# Patient Record
Sex: Male | Born: 1980
Health system: Southern US, Community
[De-identification: ages and names within clinical notes are randomized; demographics above are authoritative.]

## PROBLEM LIST (undated history)

## (undated) DIAGNOSIS — J45909 Unspecified asthma, uncomplicated: Secondary | ICD-10-CM

## (undated) DIAGNOSIS — F32A Depression, unspecified: Secondary | ICD-10-CM

## (undated) DIAGNOSIS — F419 Anxiety disorder, unspecified: Secondary | ICD-10-CM

## (undated) DIAGNOSIS — A0472 Enterocolitis due to Clostridium difficile, not specified as recurrent: Secondary | ICD-10-CM

## (undated) DIAGNOSIS — K5792 Diverticulitis of intestine, part unspecified, without perforation or abscess without bleeding: Secondary | ICD-10-CM

## (undated) DIAGNOSIS — C801 Malignant (primary) neoplasm, unspecified: Secondary | ICD-10-CM

## (undated) DIAGNOSIS — T7840XA Allergy, unspecified, initial encounter: Secondary | ICD-10-CM

## (undated) HISTORY — DX: Diverticulitis of intestine, part unspecified, without perforation or abscess without bleeding: K57.92

## (undated) HISTORY — PX: WISDOM TOOTH EXTRACTION: SHX21

## (undated) HISTORY — DX: Allergy, unspecified, initial encounter: T78.40XA

## (undated) HISTORY — DX: Enterocolitis due to Clostridium difficile, not specified as recurrent: A04.72

## (undated) HISTORY — DX: Depression, unspecified: F32.A

## (undated) HISTORY — DX: Anxiety disorder, unspecified: F41.9

---

## 2006-02-24 ENCOUNTER — Ambulatory Visit: Payer: Self-pay | Admitting: Family Medicine

## 2011-05-24 DIAGNOSIS — C801 Malignant (primary) neoplasm, unspecified: Secondary | ICD-10-CM

## 2011-05-24 HISTORY — PX: MELANOMA EXCISION: SHX5266

## 2011-05-24 HISTORY — DX: Malignant (primary) neoplasm, unspecified: C80.1

## 2014-06-04 ENCOUNTER — Ambulatory Visit: Payer: Self-pay | Admitting: Internal Medicine

## 2017-01-07 ENCOUNTER — Encounter: Payer: Self-pay | Admitting: Medical Oncology

## 2017-01-07 ENCOUNTER — Emergency Department: Payer: 59

## 2017-01-07 ENCOUNTER — Inpatient Hospital Stay
Admission: EM | Admit: 2017-01-07 | Discharge: 2017-01-12 | DRG: 392 | Disposition: A | Discharge status: Home / Self Care | Payer: 59 | Attending: Surgery | Admitting: Surgery

## 2017-01-07 DIAGNOSIS — R109 Unspecified abdominal pain: Secondary | ICD-10-CM | POA: Diagnosis not present

## 2017-01-07 DIAGNOSIS — Z6841 Body Mass Index (BMI) 40.0 and over, adult: Secondary | ICD-10-CM | POA: Diagnosis not present

## 2017-01-07 DIAGNOSIS — K5732 Diverticulitis of large intestine without perforation or abscess without bleeding: Principal | ICD-10-CM | POA: Diagnosis present

## 2017-01-07 DIAGNOSIS — J45909 Unspecified asthma, uncomplicated: Secondary | ICD-10-CM | POA: Diagnosis not present

## 2017-01-07 DIAGNOSIS — K4090 Unilateral inguinal hernia, without obstruction or gangrene, not specified as recurrent: Secondary | ICD-10-CM | POA: Diagnosis present

## 2017-01-07 DIAGNOSIS — R1030 Lower abdominal pain, unspecified: Secondary | ICD-10-CM | POA: Diagnosis present

## 2017-01-07 DIAGNOSIS — K5792 Diverticulitis of intestine, part unspecified, without perforation or abscess without bleeding: Secondary | ICD-10-CM | POA: Diagnosis present

## 2017-01-07 DIAGNOSIS — K5700 Diverticulitis of small intestine with perforation and abscess without bleeding: Secondary | ICD-10-CM | POA: Diagnosis not present

## 2017-01-07 HISTORY — DX: Unspecified asthma, uncomplicated: J45.909

## 2017-01-07 HISTORY — DX: Diverticulitis of intestine, part unspecified, without perforation or abscess without bleeding: K57.92

## 2017-01-07 LAB — COMPREHENSIVE METABOLIC PANEL
ALK PHOS: 50 U/L (ref 38–126)
ALT: 37 U/L (ref 17–63)
AST: 29 U/L (ref 15–41)
Albumin: 4.3 g/dL (ref 3.5–5.0)
Anion gap: 9 (ref 5–15)
BUN: 11 mg/dL (ref 6–20)
CALCIUM: 9.1 mg/dL (ref 8.9–10.3)
CO2: 23 mmol/L (ref 22–32)
CREATININE: 1.17 mg/dL (ref 0.61–1.24)
Chloride: 103 mmol/L (ref 101–111)
GFR calc non Af Amer: >60 mL/min (ref >60)
GLUCOSE: 127 mg/dL — AB (ref 65–99)
Potassium: 4 mmol/L (ref 3.5–5.1)
Sodium: 135 mmol/L (ref 135–145)
Total Bilirubin: 1.3 mg/dL — ABNORMAL HIGH (ref 0.3–1.2)
Total Protein: 7.4 g/dL (ref 6.5–8.1)

## 2017-01-07 LAB — URINALYSIS, COMPLETE (UACMP) WITH MICROSCOPIC
Bacteria, UA: NONE SEEN
Bilirubin Urine: NEGATIVE
GLUCOSE, UA: NEGATIVE mg/dL
HGB URINE DIPSTICK: NEGATIVE
KETONES UR: NEGATIVE mg/dL
Leukocytes, UA: NEGATIVE
NITRITE: NEGATIVE
PH: 5 (ref 5.0–8.0)
Protein, ur: 30 mg/dL — AB
RBC / HPF: NONE SEEN RBC/hpf (ref 0–5)
Specific Gravity, Urine: 1.028 (ref 1.005–1.030)
Squamous Epithelial / LPF: NONE SEEN

## 2017-01-07 LAB — CBC
HCT: 49 % (ref 40.0–52.0)
Hemoglobin: 16.6 g/dL (ref 13.0–18.0)
MCH: 28.4 pg (ref 26.0–34.0)
MCHC: 33.8 g/dL (ref 32.0–36.0)
MCV: 84 fL (ref 80.0–100.0)
PLATELETS: 173 10*3/uL (ref 150–440)
RBC: 5.84 MIL/uL (ref 4.40–5.90)
RDW: 14.1 % (ref 11.5–14.5)
WBC: 10.4 10*3/uL (ref 3.8–10.6)

## 2017-01-07 LAB — LIPASE, BLOOD: Lipase: 28 U/L (ref 11–51)

## 2017-01-07 MED ORDER — LACTATED RINGERS IV SOLN
INTRAVENOUS | Status: DC
  Administered 2017-01-07: 1 mL via INTRAVENOUS
  Administered 2017-01-08 (×2): via INTRAVENOUS

## 2017-01-07 MED ORDER — OXYCODONE HCL 5 MG PO TABS
5.0000 mg | ORAL_TABLET | ORAL | Status: DC | PRN
  Administered 2017-01-08 (×3): 5 mg via ORAL
  Filled 2017-01-07 (×3): qty 1

## 2017-01-07 MED ORDER — PIPERACILLIN-TAZOBACTAM 3.375 G IVPB 30 MIN
3.3750 g | Freq: Three times a day (TID) | INTRAVENOUS | Status: DC
  Administered 2017-01-08: 3.375 g via INTRAVENOUS
  Filled 2017-01-07 (×4): qty 50

## 2017-01-07 MED ORDER — ONDANSETRON HCL 4 MG/2ML IJ SOLN
4.0000 mg | Freq: Once | INTRAMUSCULAR | Status: AC
  Administered 2017-01-07: 4 mg via INTRAVENOUS
  Filled 2017-01-07: qty 2

## 2017-01-07 MED ORDER — SODIUM CHLORIDE 0.9 % IV SOLN
1000.0000 mL | Freq: Once | INTRAVENOUS | Status: AC
  Administered 2017-01-07: 1000 mL via INTRAVENOUS

## 2017-01-07 MED ORDER — IOPAMIDOL (ISOVUE-300) INJECTION 61%
125.0000 mL | Freq: Once | INTRAVENOUS | Status: AC | PRN
  Administered 2017-01-07: 125 mL via INTRAVENOUS

## 2017-01-07 MED ORDER — HYDROMORPHONE HCL 1 MG/ML IJ SOLN
1.0000 mg | INTRAMUSCULAR | Status: DC | PRN
  Administered 2017-01-08: 1 mg via INTRAVENOUS
  Filled 2017-01-07: qty 1

## 2017-01-07 MED ORDER — HEPARIN SODIUM (PORCINE) 5000 UNIT/ML IJ SOLN
5000.0000 [IU] | Freq: Three times a day (TID) | INTRAMUSCULAR | Status: DC
  Administered 2017-01-07 – 2017-01-08 (×2): 5000 [IU] via SUBCUTANEOUS
  Filled 2017-01-07: qty 1

## 2017-01-07 MED ORDER — MORPHINE SULFATE (PF) 4 MG/ML IV SOLN
4.0000 mg | Freq: Once | INTRAVENOUS | Status: AC
  Administered 2017-01-07: 4 mg via INTRAVENOUS
  Filled 2017-01-07: qty 1

## 2017-01-07 MED ORDER — IOPAMIDOL (ISOVUE-300) INJECTION 61%
30.0000 mL | Freq: Once | INTRAVENOUS | Status: AC
  Administered 2017-01-07: 30 mL via ORAL

## 2017-01-07 MED ORDER — HEPARIN SODIUM (PORCINE) 5000 UNIT/ML IJ SOLN
INTRAMUSCULAR | Status: AC
  Administered 2017-01-07: 5000 [IU] via SUBCUTANEOUS
  Filled 2017-01-07: qty 1

## 2017-01-07 MED ORDER — ONDANSETRON HCL 4 MG PO TABS
4.0000 mg | ORAL_TABLET | Freq: Four times a day (QID) | ORAL | Status: DC | PRN
  Administered 2017-01-11: 4 mg via ORAL
  Filled 2017-01-07: qty 1

## 2017-01-07 MED ORDER — ONDANSETRON HCL 4 MG/2ML IJ SOLN: 4.0000 mg | Freq: Four times a day (QID) | INTRAMUSCULAR | Status: DC | PRN

## 2017-01-07 MED ORDER — PIPERACILLIN-TAZOBACTAM 3.375 G IVPB 30 MIN
3.3750 g | Freq: Once | INTRAVENOUS | Status: AC
  Administered 2017-01-07: 3.375 g via INTRAVENOUS

## 2017-01-07 MED ORDER — PIPERACILLIN-TAZOBACTAM 3.375 G IVPB 30 MIN
INTRAVENOUS | Status: AC
  Filled 2017-01-07: qty 50

## 2017-01-07 NOTE — ED Triage Notes (Signed)
Lower abd pain that began Thursday denies Vomiting/Diarrhea. Reports some nausea today.

## 2017-01-07 NOTE — H&P (Signed)
Scott Aguilar is an 36 y.o. male.    Chief Complaint: Lower abdominal pain  HPI: This patient with a three-day history of lower abdominal pain it has been worsening since Thursday. He denies fevers but did have some chills has had normal bowel movements without diarrhea or melena he did have an episode like this before last year that spontaneously resolved after several days. He did not require nor seek out medical care for that. The pain is worse and worsening. He points to the suprapubic area He had a single emesis upon arrival to the emergency room and remained slightly nauseated. There is no family history of colon cancer or diverticulitis  Patient takes no medications has no medical or surgical history.  He does not smoke and works in a [employer]. He is accompanied by his wife.  Past Medical History:  Diagnosis Date  . Asthma     No past surgical history on file.  No family history on file. Social History:  has no tobacco, alcohol, and drug history on file.  Allergies: No Known Allergies   (Not in a hospital admission)   Review of Systems  Constitutional: Negative for chills and fever.  HENT: Negative.   Eyes: Negative.   Respiratory: Negative.   Cardiovascular: Negative.   Gastrointestinal: Positive for abdominal pain, nausea and vomiting. Negative for blood in stool, constipation, diarrhea and melena.  Genitourinary: Negative.   Musculoskeletal: Negative.   Skin: Negative.   Neurological: Negative.   Endo/Heme/Allergies: Negative.   Psychiatric/Behavioral: Negative.      Physical Exam:  BP (!) 142/85 (BP Location: Left Arm)   Pulse 97   Temp 98.9 F (37.2 C) (Oral)   Resp 18   Ht _0  (1.905 m)   Wt (!) 400 lb (181.4 kg)   SpO2 99%   BMI 50.00 kg/m   Physical Exam  Constitutional: He is oriented to person, place, and time and well-developed, well-nourished, and in no distress. No distress.  Very large male patient in no acute distress over 400 pounds   HENT:  Head: Normocephalic and atraumatic.  Eyes: Pupils are equal, round, and reactive to light. Right eye exhibits no discharge. Left eye exhibits no discharge. No scleral icterus.  Neck: Normal range of motion.  Cardiovascular: Normal rate, regular rhythm and normal heart sounds.   Pulmonary/Chest: Effort normal and breath sounds normal. No respiratory distress. He has no wheezes. He has no rales.  Abdominal: Soft. He exhibits no distension. There is tenderness. There is guarding. There is no rebound.  Minimal tenderness in the lower quadrants left greater than right and suprapubic. There is minimal tenderness in the area of a left inguinal hernia without obvious incarceration. There are some skin changes suggestive of candidal rash in his groins  Genitourinary: Penis normal.  Musculoskeletal: Normal range of motion. He exhibits no edema or tenderness.  Lymphadenopathy:    He has no cervical adenopathy.  Neurological: He is alert and oriented to person, place, and time.  Skin: Skin is warm and dry. Rash noted. He is not diaphoretic. No erythema.  Psychiatric: Mood and affect normal.  Vitals reviewed.       Results for orders placed or performed during the hospital encounter of 01/07/17 (from the past 48 hour(s))  Lipase, blood     Status: None   Collection Time: 01/07/17  6:16 PM  Result Value Ref Range   Lipase 28 11 - 51 U/L  Comprehensive metabolic panel     Status: Abnormal  Collection Time: 01/07/17  6:16 PM  Result Value Ref Range   Sodium 135 135 - 145 mmol/L   Potassium 4.0 3.5 - 5.1 mmol/L   Chloride 103 101 - 111 mmol/L   CO2 23 22 - 32 mmol/L   Glucose, Bld 127 (H) 65 - 99 mg/dL   BUN 11 6 - 20 mg/dL   Creatinine, Ser 1.17 0.61 - 1.24 mg/dL   Calcium 9.1 8.9 - 10.3 mg/dL   Total Protein 7.4 6.5 - 8.1 g/dL   Albumin 4.3 3.5 - 5.0 g/dL   AST 29 15 - 41 U/L   ALT 37 17 - 63 U/L   Alkaline Phosphatase 50 38 - 126 U/L   Total Bilirubin 1.3 (H) 0.3 - 1.2 mg/dL    GFR calc non Af Amer >60 >60 mL/min   GFR calc Af Amer >60 >60 mL/min    Comment: (NOTE) The eGFR has been calculated using the CKD EPI equation. This calculation has not been validated in all clinical situations. eGFR's persistently <60 mL/min signify possible Chronic Kidney Disease.    Anion gap 9 5 - 15  CBC     Status: None   Collection Time: 01/07/17  6:16 PM  Result Value Ref Range   WBC 10.4 3.8 - 10.6 K/uL   RBC 5.84 4.40 - 5.90 MIL/uL   Hemoglobin 16.6 13.0 - 18.0 g/dL   HCT 49.0 40.0 - 52.0 %   MCV 84.0 80.0 - 100.0 fL   MCH 28.4 26.0 - 34.0 pg   MCHC 33.8 32.0 - 36.0 g/dL   RDW 14.1 11.5 - 14.5 %   Platelets 173 150 - 440 K/uL  Urinalysis, Complete w Microscopic     Status: Abnormal   Collection Time: 01/07/17  6:25 PM  Result Value Ref Range   Color, Urine AMBER (A) YELLOW    Comment: BIOCHEMICALS MAY BE AFFECTED BY COLOR   APPearance CLEAR (A) CLEAR   Specific Gravity, Urine 1.028 1.005 - 1.030   pH 5.0 5.0 - 8.0   Glucose, UA NEGATIVE NEGATIVE mg/dL   Hgb urine dipstick NEGATIVE NEGATIVE   Bilirubin Urine NEGATIVE NEGATIVE   Ketones, ur NEGATIVE NEGATIVE mg/dL   Protein, ur 30 (A) NEGATIVE mg/dL   Nitrite NEGATIVE NEGATIVE   Leukocytes, UA NEGATIVE NEGATIVE   RBC / HPF NONE SEEN 0 - 5 RBC/hpf   WBC, UA 0-5 0 - 5 WBC/hpf   Bacteria, UA NONE SEEN NONE SEEN   Squamous Epithelial / LPF NONE SEEN NONE SEEN   Mucous PRESENT    Ct Abdomen Pelvis W Contrast  Result Date: 01/07/2017 CLINICAL DATA:  Lower abdominal pain. EXAM: CT ABDOMEN AND PELVIS WITH CONTRAST TECHNIQUE: Multidetector CT imaging of the abdomen and pelvis was performed using the standard protocol following bolus administration of intravenous contrast. CONTRAST:  118m ISOVUE-300 IOPAMIDOL (ISOVUE-300) INJECTION 61% COMPARISON:  Abdominal ultrasound 06/04/2014 FINDINGS: Lower chest: No acute abnormality. Hepatobiliary: No focal liver abnormality is seen. No gallstones, gallbladder wall thickening, or  biliary dilatation. Pancreas: Unremarkable. No pancreatic ductal dilatation or surrounding inflammatory changes. Spleen: Normal in size without focal abnormality. Adrenals/Urinary Tract: Adrenal glands are unremarkable. Kidneys are without renal calculi, focal lesion, or hydronephrosis. 12 mm right renal cyst. Bladder is unremarkable. Stomach/Bowel: There is a relatively short segment of irregular mucosal thickening and pericolonic inflammatory changes in the distal sigmoid colon/ proximal rectum, best seen on axial image 19/115, sequence 2 or coronal image 68/112, sequence 5. Scattered diverticula are seen of the  sigmoid colon. Vascular/Lymphatic: No significant vascular findings are present. No enlarged abdominal or pelvic lymph nodes. Reproductive:  Normal prostate gland. Other: Left indirect inguinal hernia contains peritoneal fat as well as a small portion of the urinary bladder. There is an associated fat stranding. Musculoskeletal: No acute or significant osseous findings. IMPRESSION: Relatively short segment of irregular mucosal thickening and pericolonic inflammatory changes in the distal sigmoid colon/ proximal rectum on the background of scattered sigmoid diverticulosis. This may represent diverticulitis, however underlying colonic mass cannot be excluded. Indirect left inguinal hernia containing peritoneal fat and a small portion of the urinary bladder with fat stranding which may be due to inflammatory changes or potentially a strangulation. These results were called by telephone at the time of interpretation on 01/07/2017 at 9:34 pm to Dr. Lavonia Drafts , who verbally acknowledged these results. Electronically Signed   By: Fidela Salisbury M.D.   On: 01/07/2017 21:38     Assessment/Plan  CT scan is personally reviewed. There is likely signs of acute diverticulitis. There is also signs of a small portion of the urinary bladder extending into a very small fat containing left inguinal hernia. I do  not believe this is the source of his pain but rather this is acute diverticulitis.  I recommended admission in the hospital with IV antibiotics. The rationale for this was discussed the options of observation reviewed and the risks of worsening needing surgery at some point were discussed with him. I would also prefer to place a Foley catheter to decompress the urinary bladder as there is question about the small portion of urinary bladder extending into a left anal hernia which could be a source for part of his pain but I do not believe it is so. Family understood and agreed with this plan of this discussed with the emergency room physician.  Florene Glen, MD, FACS

## 2017-01-07 NOTE — ED Notes (Signed)
Pt. States lower abdominal pain that started Thursday afternoon.  Pt states increasing pain to today.  Pt. Also indicates difficulty with urine output and dysuria.

## 2017-01-07 NOTE — ED Provider Notes (Signed)
[facility] Emergency Department Provider Note   ____________________________________________    I have reviewed the triage vital signs and the nursing notes.   HISTORY  Chief Complaint Abdominal Pain     HPI Scott Aguilar is a 36 y.o. male who presents with complaints of abdominal pain. Patient reports pain started 2 days ago and has become steadily worse, this afternoon it became even more painful. He describes it as a 7 out of 10, sharp pain in his lower abdomen bilaterally. He has never had this before. No history of abdominal surgeries. No fevers or chills. No nausea or vomiting. Positive decreased appetite. No recent travel. No diarrhea, normal stools.   Past Medical History:  Diagnosis Date  . Asthma     There are no active problems to display for this patient.   No past surgical history on file.  Prior to Admission medications   Not on File     Allergies Patient has no known allergies.  No family history on file.  Social History Social History  Substance Use Topics  . Smoking status: Not on file  . Smokeless tobacco: Not on file  . Alcohol use Not on file  Does not smoke, occasional alcohol use  Review of Systems  Constitutional: No fever/chills Eyes: No visual changes.  ENT: No sore throat. Cardiovascular: Denies chest pain. Respiratory: Denies shortness of breath. Gastrointestinal: As above Genitourinary: Negative for dysuria. Musculoskeletal: Negative for back pain. Skin: Negative for rash. Neurological: Negative for headaches    ____________________________________________   PHYSICAL EXAM:  VITAL SIGNS: ED Triage Vitals  Enc Vitals Group     BP 01/07/17 1806 (!) 142/85     Pulse Rate 01/07/17 1806 99     Resp 01/07/17 1806 18     Temp 01/07/17 1806 98.9 F (37.2 C)     Temp Source 01/07/17 1806 Oral     SpO2 01/07/17 1806 96 %     Weight 01/07/17 1807 (!) 181.4 kg (400 lb)     Height 01/07/17 1807  1.905 m (6\' 3" )     Head Circumference --      Peak Flow --      Pain Score 01/07/17 1806 5     Pain Loc --      Pain Edu? --      Excl. in [place]? --     Constitutional: Alert and oriented. No acute distress.  Eyes: Conjunctivae are normal.  Head: Atraumatic. Nose: No congestion/rhinnorhea. Mouth/Throat: Mucous membranes are moist.   Neck:  Painless ROM Cardiovascular: Normal rate, regular rhythm. Grossly normal heart sounds.  Good peripheral circulation. Respiratory: Normal respiratory effort.  No retractions. Lungs CTAB. Gastrointestinal: Tenderness to palpation in the lower abdomen bilaterally, moderate. No distention.  No CVA tenderness. Genitourinary: deferred Musculoskeletal:  Warm and well perfused Neurologic:  Normal speech and language. No gross focal neurologic deficits are appreciated.  Skin:  Skin is warm, dry and intact. No rash noted. Psychiatric: Mood and affect are normal. Speech and behavior are normal.  ____________________________________________   LABS (all labs ordered are listed, but only abnormal results are displayed)  Labs Reviewed  COMPREHENSIVE METABOLIC PANEL - Abnormal; Notable for the following:       Result Value   Glucose, Bld 127 (*)    Total Bilirubin 1.3 (*)    All other components within normal limits  URINALYSIS, COMPLETE (UACMP) WITH MICROSCOPIC - Abnormal; Notable for the following:    Color, Urine AMBER (*)  APPearance CLEAR (*)    Protein, ur 30 (*)    All other components within normal limits  LIPASE, BLOOD  CBC   ____________________________________________  EKG  None ____________________________________________  RADIOLOGY  CT abdomen and pelvis pending ____________________________________________   PROCEDURES  Procedure(s) performed: No    Critical Care performed: No ____________________________________________   INITIAL IMPRESSION / ASSESSMENT AND PLAN / ED COURSE  Pertinent labs & imaging results that  were available during my care of the patient were reviewed by me and considered in my medical decision making (see chart for details).  Patient presents with lower abdominal pain, lab work is reassuring however the patient's significant tender in the lower abdomen bilaterally. Suspicious for diverticulitis. We will give IV Zofran and IV morphine, obtain CT abdomen and pelvis and reevaluate  ----------------------------------------- 9:46 PM on 01/07/2017 -----------------------------------------  Called by radiologist of possible diverticulitis/cannot exclude underlying mass as well as left inguinal hernia. Discussed with Dr. Burt Knack of surgery, he will see the patient in the ED  Dr. Burt Knack will admit the patient, requested dose of IV Zosyn in the ED    ____________________________________________   FINAL CLINICAL IMPRESSION(S) / ED DIAGNOSES  Final diagnoses:  Diverticulitis      NEW MEDICATIONS STARTED DURING THIS VISIT:  New Prescriptions   No medications on file     Note:  This document was prepared using Dragon voice recognition software and may include unintentional dictation errors.    Lavonia Drafts, MD 01/07/17 2217

## 2017-01-08 LAB — CBC
HEMATOCRIT: 44.4 % (ref 40.0–52.0)
Hemoglobin: 15.1 g/dL (ref 13.0–18.0)
MCH: 28.6 pg (ref 26.0–34.0)
MCHC: 33.9 g/dL (ref 32.0–36.0)
MCV: 84.3 fL (ref 80.0–100.0)
Platelets: 156 10*3/uL (ref 150–440)
RBC: 5.27 MIL/uL (ref 4.40–5.90)
RDW: 14.3 % (ref 11.5–14.5)
WBC: 10.9 10*3/uL — ABNORMAL HIGH (ref 3.8–10.6)

## 2017-01-08 LAB — BASIC METABOLIC PANEL
ANION GAP: 7 (ref 5–15)
BUN: 11 mg/dL (ref 6–20)
CALCIUM: 8.4 mg/dL — AB (ref 8.9–10.3)
CO2: 24 mmol/L (ref 22–32)
CREATININE: 1.08 mg/dL (ref 0.61–1.24)
Chloride: 104 mmol/L (ref 101–111)
GFR calc Af Amer: >60 mL/min (ref >60)
GFR calc non Af Amer: >60 mL/min (ref >60)
GLUCOSE: 128 mg/dL — AB (ref 65–99)
Potassium: 3.8 mmol/L (ref 3.5–5.1)
Sodium: 135 mmol/L (ref 135–145)

## 2017-01-08 MED ORDER — KETOROLAC TROMETHAMINE 30 MG/ML IJ SOLN
30.0000 mg | Freq: Four times a day (QID) | INTRAMUSCULAR | Status: AC
  Administered 2017-01-08 – 2017-01-09 (×6): 30 mg via INTRAVENOUS
  Filled 2017-01-08 (×7): qty 1

## 2017-01-08 MED ORDER — ENOXAPARIN SODIUM 40 MG/0.4ML ~~LOC~~ SOLN: 40.0000 mg | SUBCUTANEOUS | Status: DC

## 2017-01-08 MED ORDER — ENOXAPARIN SODIUM 40 MG/0.4ML ~~LOC~~ SOLN
40.0000 mg | Freq: Two times a day (BID) | SUBCUTANEOUS | Status: DC
  Administered 2017-01-08 – 2017-01-11 (×8): 40 mg via SUBCUTANEOUS
  Filled 2017-01-08 (×8): qty 0.4

## 2017-01-08 MED ORDER — ACETAMINOPHEN 325 MG PO TABS
650.0000 mg | ORAL_TABLET | Freq: Four times a day (QID) | ORAL | Status: DC | PRN
  Administered 2017-01-08 (×2): 650 mg via ORAL
  Filled 2017-01-08 (×2): qty 2

## 2017-01-08 MED ORDER — OXYCODONE-ACETAMINOPHEN 5-325 MG PO TABS
1.0000 | ORAL_TABLET | ORAL | Status: DC | PRN
  Administered 2017-01-08: 1 via ORAL
  Administered 2017-01-09 – 2017-01-10 (×8): 2 via ORAL
  Administered 2017-01-11: 1 via ORAL
  Administered 2017-01-11: 2 via ORAL
  Administered 2017-01-11 – 2017-01-12 (×4): 1 via ORAL
  Filled 2017-01-08: qty 1
  Filled 2017-01-08 (×4): qty 2
  Filled 2017-01-08: qty 1
  Filled 2017-01-08: qty 2
  Filled 2017-01-08: qty 1
  Filled 2017-01-08 (×3): qty 2
  Filled 2017-01-08: qty 1
  Filled 2017-01-08 (×3): qty 2
  Filled 2017-01-08: qty 1

## 2017-01-08 MED ORDER — PIPERACILLIN-TAZOBACTAM 3.375 G IVPB
3.3750 g | Freq: Three times a day (TID) | INTRAVENOUS | Status: DC
  Administered 2017-01-08 – 2017-01-11 (×9): 3.375 g via INTRAVENOUS
  Filled 2017-01-08 (×8): qty 50

## 2017-01-08 MED ORDER — HYDROMORPHONE HCL 1 MG/ML IJ SOLN
1.0000 mg | INTRAMUSCULAR | Status: DC | PRN
  Administered 2017-01-08: 1 mg via INTRAVENOUS
  Filled 2017-01-08: qty 1

## 2017-01-08 MED ORDER — KCL IN DEXTROSE-NACL 20-5-0.45 MEQ/L-%-% IV SOLN
INTRAVENOUS | Status: DC
  Administered 2017-01-08 – 2017-01-11 (×8): via INTRAVENOUS
  Filled 2017-01-08 (×11): qty 1000

## 2017-01-08 NOTE — Progress Notes (Signed)
MD pyreddy returned page and notified of pt running temp of 100.8 and no anti pyretics ordered. Verbal order for tylenol 650mg  po q6hr prn fever. Will place order

## 2017-01-08 NOTE — Progress Notes (Signed)
SURGICAL PROGRESS NOTE (cpt [phone])  Hospital Day(s): 1.   Post op day(s):  Scott Aguilar   Interval History: Patient seen and examined, no acute events or new complaints since admitted overnight. Patient reports his pain has improved somewhat even when meds wear off, but continues to be only moderately controlled. He denies N/V, fever/chills, CP, or SOB. Patient reports that he's been aware of his Left inguinal hernia for a few years, but it only sometimes causes discomfort. Patient also expresses he's tried unsuccessfully to lose weight with dietary changes, exercise, and medications prescribed by his PMD.  Review of Systems:  Constitutional: denies fever, chills  HEENT: denies cough or congestion  Respiratory: denies any shortness of breath  Cardiovascular: denies chest pain or palpitations  Gastrointestinal: abdominal pain, N/V, and bowel function as per interval history Genitourinary: denies burning with urination or urinary frequency Musculoskeletal: denies pain, decreased motor or sensation Integumentary: denies any other rashes or skin discolorations Neurological: denies HA or vision/hearing changes   Vital signs in last 24 hours: [min-max] current  Temp:  [98.9 F (37.2 C)-100.8 F (38.2 C)] 99.1 F (37.3 C) (08/19 0653) Pulse Rate:  [95-105] 100 (08/19 0520) Resp:  [18] 18 (08/19 0520) BP: (113-151)/(62-101) 113/62 (08/19 0520) SpO2:  [96 %-100 %] 96 % (08/19 0520) Weight:  [396 lb 9.6 oz (179.9 kg)-400 lb (181.4 kg)] 396 lb 9.6 oz (179.9 kg) (08/19 0005)     Height: 6\' 2"  (188 cm) Weight: (!) 396 lb 9.6 oz (179.9 kg) BMI (Calculated): 51   Intake/Output this shift:  Total I/O In: 958.3 [I.V.:953.3; IV Piggyback:5] Out: -    Intake/Output last 2 shifts:  @IOLAST2SHIFTS @   Physical Exam:  Constitutional: alert, cooperative and no distress  HENT: normocephalic without obvious abnormality  Eyes: PERRL, EOM's grossly intact and symmetric  Neuro: CN II - XII grossly intact and  symmetric without deficit  Respiratory: breathing non-labored at rest  Cardiovascular: regular rate and sinus rhythm  Gastrointestinal: soft, morbidly obese, moderate LLQ > suprapubic tenderness to palpation, no guarding or rebound tenderness, easily reducible Left inguinal hernia (reduced at bedside) Musculoskeletal: UE and LE FROM, motor and sensation grossly intact, NT   Labs:  CBC Latest Ref Rng & Units 01/08/2017 01/07/2017  WBC 3.8 - 10.6 K/uL 10.9(H) 10.4  Hemoglobin 13.0 - 18.0 g/dL 15.1 16.6  Hematocrit 40.0 - 52.0 % 44.4 49.0  Platelets 150 - 440 K/uL 156 173   CMP Latest Ref Rng & Units 01/08/2017 01/07/2017  Glucose 65 - 99 mg/dL 128(H) 127(H)  BUN 6 - 20 mg/dL 11 11  Creatinine 0.61 - 1.24 mg/dL 1.08 1.17  Sodium 135 - 145 mmol/L 135 135  Potassium 3.5 - 5.1 mmol/L 3.8 4.0  Chloride 101 - 111 mmol/L 104 103  CO2 22 - 32 mmol/L 24 23  Calcium 8.9 - 10.3 mg/dL 8.4(L) 9.1  Total Protein 6.5 - 8.1 g/dL - 7.4  Total Bilirubin 0.3 - 1.2 mg/dL - 1.3(H)  Alkaline Phos 38 - 126 U/L - 50  AST 15 - 41 U/L - 29  ALT 17 - 63 U/L - 37   Imaging studies: No new pertinent imaging studies, though admission CT imaging personally reviewed with patient and his family   Assessment/Plan: (ICD-10's: K68.32, K31.90) 36 y.o. male with acute sigmoid colonic diverticulitis without abscess, complicated by pertinent comorbidities including morbid obesity (BMI 51), asthma, and non-incarcerated Left inguinal hernia involving the urinary bladder.   - pain control prn  - continue IV antibiotics  -  continue clear liquids diet, IVF for now  - weight loss discussed, will provide bariatric seminar info  - DVT prophylaxis, ambulation encouraged   - will remove Foley catheter tomorrow  All of the above findings and recommendations were discussed with the patient, patient's family, and the medical team, and all of patient's and family's questions were answered to their expressed satisfaction.  -- Marilynne Drivers Rosana Hoes, MD, [place]: [facility] - Partnering for exceptional care. Office: [phone]

## 2017-01-08 NOTE — Progress Notes (Signed)
Pharmacy adjusted dose of Lovenox for prophylaxis to 40mg  BID due to patient BMI > 40 per protocol.  Charlane Ferretti, RPh 01/08/2017

## 2017-01-09 LAB — CBC
HEMATOCRIT: 44.4 % (ref 40.0–52.0)
Hemoglobin: 15.2 g/dL (ref 13.0–18.0)
MCH: 28.7 pg (ref 26.0–34.0)
MCHC: 34.1 g/dL (ref 32.0–36.0)
MCV: 84.2 fL (ref 80.0–100.0)
PLATELETS: 139 10*3/uL — AB (ref 150–440)
RBC: 5.28 MIL/uL (ref 4.40–5.90)
RDW: 14.5 % (ref 11.5–14.5)
WBC: 11.4 10*3/uL — AB (ref 3.8–10.6)

## 2017-01-09 NOTE — Progress Notes (Signed)
CC: Diverticulitis Subjective: Patient reports that he actually feels better today than he did yesterday but continues to have lower abdominal pain. It is not as intense. He is tolerating his liquids without nausea or vomiting. He is having bowel function.  Objective: Vital signs in last 24 hours: Temp:  [98 F (36.7 C)-100.5 F (38.1 C)] 100.5 F (38.1 C) (08/20 0444) Pulse Rate:  [88-100] 100 (08/20 0444) Resp:  [17-21] 20 (08/20 0444) BP: (107-139)/(65-76) 124/69 (08/20 0444) SpO2:  [96 %-98 %] 97 % (08/20 0444) Last BM Date: 01/07/17  Intake/Output from previous day: 08/19 0701 - 08/20 0700 In: 3236.3 [P.O.:240; I.V.:2937.3; IV Piggyback:59] Out: 9629 [BMWUX:3244] Intake/Output this shift: Total I/O In: 0102 [P.O.:900; I.V.:772; IV Piggyback:43] Out: 300 [Urine:300]  Physical exam:  Gen.: No acute distress  chest: Clear to auscultation Heart: Regular rate and rhythm Abdomen: Very large, soft, tender to palpation in the lower abdomen but without rebound or guarding   Lab Results: CBC   Recent Labs  01/08/17 0516 01/09/17 0448  WBC 10.9* 11.4*  HGB 15.1 15.2  HCT 44.4 44.4  PLT 156 139*   BMET  Recent Labs  01/07/17 1816 01/08/17 0516  NA 135 135  K 4.0 3.8  CL 103 104  CO2 23 24  GLUCOSE 127* 128*  BUN 11 11  CREATININE 1.17 1.08  CALCIUM 9.1 8.4*   PT/INR No results for input(s): LABPROT, INR in the last 72 hours. ABG No results for input(s): PHART, HCO3 in the last 72 hours.  Invalid input(s): PCO2, PO2  Studies/Results: Ct Abdomen Pelvis W Contrast  Result Date: 01/07/2017 CLINICAL DATA:  Lower abdominal pain. EXAM: CT ABDOMEN AND PELVIS WITH CONTRAST TECHNIQUE: Multidetector CT imaging of the abdomen and pelvis was performed using the standard protocol following bolus administration of intravenous contrast. CONTRAST:  127mL ISOVUE-300 IOPAMIDOL (ISOVUE-300) INJECTION 61% COMPARISON:  Abdominal ultrasound 06/04/2014 FINDINGS: Lower chest: No  acute abnormality. Hepatobiliary: No focal liver abnormality is seen. No gallstones, gallbladder wall thickening, or biliary dilatation. Pancreas: Unremarkable. No pancreatic ductal dilatation or surrounding inflammatory changes. Spleen: Normal in size without focal abnormality. Adrenals/Urinary Tract: Adrenal glands are unremarkable. Kidneys are without renal calculi, focal lesion, or hydronephrosis. 12 mm right renal cyst. Bladder is unremarkable. Stomach/Bowel: There is a relatively short segment of irregular mucosal thickening and pericolonic inflammatory changes in the distal sigmoid colon/ proximal rectum, best seen on axial image 19/115, sequence 2 or coronal image 68/112, sequence 5. Scattered diverticula are seen of the sigmoid colon. Vascular/Lymphatic: No significant vascular findings are present. No enlarged abdominal or pelvic lymph nodes. Reproductive:  Normal prostate gland. Other: Left indirect inguinal hernia contains peritoneal fat as well as a small portion of the urinary bladder. There is an associated fat stranding. Musculoskeletal: No acute or significant osseous findings. IMPRESSION: Relatively short segment of irregular mucosal thickening and pericolonic inflammatory changes in the distal sigmoid colon/ proximal rectum on the background of scattered sigmoid diverticulosis. This may represent diverticulitis, however underlying colonic mass cannot be excluded. Indirect left inguinal hernia containing peritoneal fat and a small portion of the urinary bladder with fat stranding which may be due to inflammatory changes or potentially a strangulation. These results were called by telephone at the time of interpretation on 01/07/2017 at 9:34 pm to Dr. Lavonia Drafts , who verbally acknowledged these results. Electronically Signed   By: Fidela Salisbury M.D.   On: 01/07/2017 21:38    Anti-infectives: Anti-infectives    Start     Dose/Rate  Route Frequency Ordered Stop   01/08/17 1530   piperacillin-tazobactam (ZOSYN) IVPB 3.375 g     3.375 g 12.5 mL/hr over 240 Minutes Intravenous Every 8 hours 01/08/17 1132     01/07/17 2230  piperacillin-tazobactam (ZOSYN) IVPB 3.375 g  Status:  Discontinued     3.375 g 12.5 mL/hr over 240 Minutes Intravenous Every 8 hours 01/07/17 2228 01/08/17 1132   01/07/17 2215  piperacillin-tazobactam (ZOSYN) IVPB 3.375 g     3.375 g 100 mL/hr over 30 Minutes Intravenous  Once 01/07/17 2214 01/08/17 0003      Assessment/Plan:  36 year old male admitted for diverticulitis. Clinically improved but his labs show a mild worsening in his white blood cell count. Discussed that we would continue his IV antibiotics until both his exam and his labs were normal. Discussed that should he worsen at any point he is to let us nurse note that he can be reexamined and possibly reimaged. Patient voiced understanding. Encourage ambulation and incentive spirometer usage.  Scott Aguilar Huguenin, MD, [facility] General [facility]  Day [facility] [phone] Night [facility] [id number] 01/09/2017

## 2017-01-10 LAB — BASIC METABOLIC PANEL
Anion gap: 4 — ABNORMAL LOW (ref 5–15)
BUN: 5 mg/dL — AB (ref 6–20)
CALCIUM: 8.5 mg/dL — AB (ref 8.9–10.3)
CO2: 27 mmol/L (ref 22–32)
CREATININE: 0.95 mg/dL (ref 0.61–1.24)
Chloride: 107 mmol/L (ref 101–111)
Glucose, Bld: 119 mg/dL — ABNORMAL HIGH (ref 65–99)
Potassium: 3.8 mmol/L (ref 3.5–5.1)
SODIUM: 138 mmol/L (ref 135–145)

## 2017-01-10 LAB — CBC
HCT: 41.1 % (ref 40.0–52.0)
HEMOGLOBIN: 14 g/dL (ref 13.0–18.0)
MCH: 28.6 pg (ref 26.0–34.0)
MCHC: 34.1 g/dL (ref 32.0–36.0)
MCV: 83.8 fL (ref 80.0–100.0)
PLATELETS: 134 10*3/uL — AB (ref 150–440)
RBC: 4.91 MIL/uL (ref 4.40–5.90)
RDW: 14.1 % (ref 11.5–14.5)
WBC: 6.6 10*3/uL (ref 3.8–10.6)

## 2017-01-10 LAB — HIV ANTIBODY (ROUTINE TESTING W REFLEX): HIV Screen 4th Generation wRfx: NONREACTIVE

## 2017-01-10 NOTE — Progress Notes (Signed)
CC: Diverticulitis Subjective: 36 year old male with diverticulitis. States he is still tender but feeling somewhat better. Tolerating his clear liquids without nausea or vomiting. Has had bowel function overnight.  Objective: Vital signs in last 24 hours: Temp:  [98.1 F (36.7 C)-98.5 F (36.9 C)] 98.5 F (36.9 C) (08/21 0501) Pulse Rate:  [90-95] 91 (08/21 0501) Resp:  [18] 18 (08/21 0501) BP: (123-131)/(75-79) 123/79 (08/21 0501) SpO2:  [97 %-100 %] 97 % (08/21 0501) Last BM Date: 01/07/17  Intake/Output from previous day: 08/20 0701 - 08/21 0700 In: 2372.3 [P.O.:1380; I.V.:934; IV Piggyback:58.3] Out: 750 [Urine:750] Intake/Output this shift: Total I/O In: 2018 [I.V.:1929; IV Piggyback:89] Out: 0   Physical exam:  Gen.: No acute distress  chest: Clear to auscultation  heart: Regular rhythm Abdomen: Large, soft, tender to palpation left lower quadrant.  Lab Results: CBC   Recent Labs  01/09/17 0448 01/10/17 0344  WBC 11.4* 6.6  HGB 15.2 14.0  HCT 44.4 41.1  PLT 139* 134*   BMET  Recent Labs  01/08/17 0516 01/10/17 0344  NA 135 138  K 3.8 3.8  CL 104 107  CO2 24 27  GLUCOSE 128* 119*  BUN 11 5*  CREATININE 1.08 0.95  CALCIUM 8.4* 8.5*   PT/INR No results for input(s): LABPROT, INR in the last 72 hours. ABG No results for input(s): PHART, HCO3 in the last 72 hours.  Invalid input(s): PCO2, PO2  Studies/Results: No results found.  Anti-infectives: Anti-infectives    Start     Dose/Rate Route Frequency Ordered Stop   01/08/17 1530  piperacillin-tazobactam (ZOSYN) IVPB 3.375 g     3.375 g 12.5 mL/hr over 240 Minutes Intravenous Every 8 hours 01/08/17 1132     01/07/17 2230  piperacillin-tazobactam (ZOSYN) IVPB 3.375 g  Status:  Discontinued     3.375 g 12.5 mL/hr over 240 Minutes Intravenous Every 8 hours 01/07/17 2228 01/08/17 1132   01/07/17 2215  piperacillin-tazobactam (ZOSYN) IVPB 3.375 g     3.375 g 100 mL/hr over 30 Minutes  Intravenous  Once 01/07/17 2214 01/08/17 0003      Assessment/Plan:  36 year old male with diverticulitis. His labs have normalized today. Discussed with the patient that pain resolution usually follows within 24 hours of normalization of labs. We will continue clear liquids and IV antibiotics until he is symptomatically better. Encourage ambulation and incentive spirometer usage.  Athan Casalino T. Adonis Huguenin, MD, [facility] General Surgeon [facility]  Day [facility] [phone] Night [facility] [phone] 01/10/2017

## 2017-01-11 LAB — CBC
HCT: 41.5 % (ref 40.0–52.0)
HEMOGLOBIN: 14.1 g/dL (ref 13.0–18.0)
MCH: 28.6 pg (ref 26.0–34.0)
MCHC: 34 g/dL (ref 32.0–36.0)
MCV: 83.9 fL (ref 80.0–100.0)
Platelets: 168 10*3/uL (ref 150–440)
RBC: 4.94 MIL/uL (ref 4.40–5.90)
RDW: 14.2 % (ref 11.5–14.5)
WBC: 5.8 10*3/uL (ref 3.8–10.6)

## 2017-01-11 LAB — BASIC METABOLIC PANEL
ANION GAP: 7 (ref 5–15)
BUN: <5 mg/dL — ABNORMAL LOW (ref 6–20)
CHLORIDE: 106 mmol/L (ref 101–111)
CO2: 27 mmol/L (ref 22–32)
CREATININE: 0.93 mg/dL (ref 0.61–1.24)
Calcium: 8.8 mg/dL — ABNORMAL LOW (ref 8.9–10.3)
GFR calc non Af Amer: >60 mL/min (ref >60)
Glucose, Bld: 121 mg/dL — ABNORMAL HIGH (ref 65–99)
POTASSIUM: 4 mmol/L (ref 3.5–5.1)
SODIUM: 140 mmol/L (ref 135–145)

## 2017-01-11 MED ORDER — AMOXICILLIN-POT CLAVULANATE 875-125 MG PO TABS
1.0000 | ORAL_TABLET | Freq: Two times a day (BID) | ORAL | Status: DC
  Administered 2017-01-11 – 2017-01-12 (×3): 1 via ORAL
  Filled 2017-01-11 (×3): qty 1

## 2017-01-11 MED ORDER — PREMIER PROTEIN SHAKE
2.0000 [oz_av] | Freq: Four times a day (QID) | ORAL | Status: DC
  Administered 2017-01-11 – 2017-01-12 (×2): 2 [oz_av] via ORAL

## 2017-01-11 NOTE — Progress Notes (Signed)
Nutrition Education Note  RD consulted for nutrition education regarding a low fiber diet for diverticulitis.  RD provided "Nutrition and Diverticulitis" handout with supporting information. Reviewed patient's dietary recall. Provided examples of low and high fiber foods. Discouraged intake of high fiber foods, high fat foods, spicy foods, processed foods, caffeine and red meats when having a flare. Encouraged pt to cook foods until they are soft and chew foods well to help aid in digestion. Also recommend frequent small meals. Encouraged use of a multi-vitamin and protein supplements while having a flare.    RD encourage intake of high fiber foods when not having a flare.   Teach back method used.  Expect good compliance.  Body mass index is 50.92 kg/m. Pt meets criteria for morbid obesity based on current BMI.  Current diet order is full liquid, patient is consuming approximately 100% of meals at this time.   RD will order Premier Protein BID to help pt meet estimated protein needs as pt is morbidly obese, each supplement provides 160kcal and 30g protein.   Labs and medications reviewed. No further nutrition interventions warranted at this time. RD contact information provided. If additional nutrition issues arise, please re-consult RD.  Koleen Distance MS, RD, LDN Pager #- [phone] After Hours Pager: [phone]

## 2017-01-11 NOTE — Progress Notes (Signed)
CC: Diverticulitis Subjective: 36 year old male with sigmoid diverticulitis. Reports that he is feeling much better and is hungry. He is having bowel function. He continues to have some lower abdominal tenderness but is a fraction was when he came into the hospital.  Objective: Vital signs in last 24 hours: Temp:  [98.4 F (36.9 C)-99 F (37.2 C)] 98.4 F (36.9 C) (08/22 0451) Pulse Rate:  [80-88] 80 (08/22 0451) Resp:  [20] 20 (08/22 0451) BP: (119-129)/(62-78) 129/73 (08/22 0451) SpO2:  [97 %-98 %] 97 % (08/22 0451) Last BM Date: 01/09/17  Intake/Output from previous day: 08/21 0701 - 08/22 0700 In: 5460 [P.O.:960; I.V.:4277; IV Piggyback:223] Out: 1200 [Urine:1200] Intake/Output this shift: No intake/output data recorded.  Physical exam:  Gen.: No acute distress Chest: Clear to auscultation Heart: Regular rate and rhythm Abdomen: Large, soft, minimally tender to deep palpation in the lower abdomen, nondistended  Lab Results: CBC   Recent Labs  01/10/17 0344 01/11/17 0322  WBC 6.6 5.8  HGB 14.0 14.1  HCT 41.1 41.5  PLT 134* 168   BMET  Recent Labs  01/10/17 0344 01/11/17 0322  NA 138 140  K 3.8 4.0  CL 107 106  CO2 27 27  GLUCOSE 119* 121*  BUN 5* <5*  CREATININE 0.95 0.93  CALCIUM 8.5* 8.8*   PT/INR No results for input(s): LABPROT, INR in the last 72 hours. ABG No results for input(s): PHART, HCO3 in the last 72 hours.  Invalid input(s): PCO2, PO2  Studies/Results: No results found.  Anti-infectives: Anti-infectives    Start     Dose/Rate Route Frequency Ordered Stop   01/11/17 1000  amoxicillin-clavulanate (AUGMENTIN) 875-125 MG per tablet 1 tablet     1 tablet Oral Every 12 hours 01/11/17 0749     01/08/17 1530  piperacillin-tazobactam (ZOSYN) IVPB 3.375 g  Status:  Discontinued     3.375 g 12.5 mL/hr over 240 Minutes Intravenous Every 8 hours 01/08/17 1132 01/11/17 0749   01/07/17 2230  piperacillin-tazobactam (ZOSYN) IVPB 3.375 g   Status:  Discontinued     3.375 g 12.5 mL/hr over 240 Minutes Intravenous Every 8 hours 01/07/17 2228 01/08/17 1132   01/07/17 2215  piperacillin-tazobactam (ZOSYN) IVPB 3.375 g     3.375 g 100 mL/hr over 30 Minutes Intravenous  Once 01/07/17 2214 01/08/17 0003      Assessment/Plan:  36 year old male with sigmoid diverticulitis. His labs have been normal for 2 days and his pain is much improved. At this point discussed that we will transition him to oral antibiotics and advance his diet. Okay to saline lock his IV at this time. Encourage ambulation and incentive spirometer usage. If tolerates oral antibiotic and increase diet he may be ready for discharge home later today but more likely tomorrow morning.  Tag Wurtz T. Adonis Huguenin, MD, [facility] General Surgeon [facility]  Day [facility] [phone] Night [facility] [phone] 01/11/2017

## 2017-01-12 MED ORDER — AMOXICILLIN-POT CLAVULANATE 875-125 MG PO TABS: 1.0000 | ORAL_TABLET | Freq: Two times a day (BID) | ORAL | 0 refills | Status: DC

## 2017-01-12 MED ORDER — ONDANSETRON HCL 4 MG PO TABS: 4.0000 mg | ORAL_TABLET | Freq: Four times a day (QID) | ORAL | 0 refills | Status: DC | PRN

## 2017-01-12 NOTE — Progress Notes (Signed)
MD ordered patient to be discharged home.  Discharge instructions were reviewed with the patient and he voiced understanding.  Follow-up appointment was made.  Prescriptions given to the patient.  IV was removed with catheter intact.  All patients questions were answered.   Patient refused to go out in a wheelchair so he walked out with his wife.

## 2017-01-12 NOTE — Discharge Instructions (Signed)

## 2017-01-12 NOTE — Discharge Summary (Signed)
Patient ID: Scott Aguilar MRN: [id number] DOB/AGE: 1980-08-12 36 y.o.  Admit date: 01/07/2017 Discharge date: 01/12/2017  Discharge Diagnoses:  Diverticulitis  Procedures Performed: None  Discharged Condition: good  Hospital Course: Patient admitted with diverticulitis. Infection and pain improved with IV antibiotics. Tolerated transition to oral antibiotics. On day of discharge abdomen was soft and minimally tender to deep palpation.  Discharge Orders: Discharge Instructions    Call MD for:  persistant nausea and vomiting    Complete by:  As directed    Call MD for:  severe uncontrolled pain    Complete by:  As directed    Call MD for:  temperature >100.4    Complete by:  As directed    Diet - low sodium heart healthy    Complete by:  As directed    Increase activity slowly    Complete by:  As directed       Disposition: Final discharge disposition not confirmed  Discharge Medications: Allergies as of 01/12/2017   No Known Allergies     Medication List    TAKE these medications   amoxicillin-clavulanate 875-125 MG tablet Commonly known as:  AUGMENTIN Take 1 tablet by mouth every 12 (twelve) hours.   ondansetron 4 MG tablet Commonly known as:  ZOFRAN Take 1 tablet (4 mg total) by mouth every 6 (six) hours as needed for nausea.            Discharge Care Instructions        Start     Ordered   01/12/17 0000  amoxicillin-clavulanate (AUGMENTIN) 875-125 MG tablet  Every 12 hours     01/12/17 0907   01/12/17 0000  ondansetron (ZOFRAN) 4 MG tablet  Every 6 hours PRN     01/12/17 0907   01/12/17 0000  Increase activity slowly     01/12/17 0907   01/12/17 0000  Diet - low sodium heart healthy     01/12/17 0907   01/12/17 0000  Call MD for:  temperature >100.4     01/12/17 0907   01/12/17 0000  Call MD for:  persistant nausea and vomiting     01/12/17 0907   01/12/17 0000  Call MD for:  severe uncontrolled pain     01/12/17 0907       Follwup: Follow-up  [facility]. Schedule an appointment as soon as possible for a visit in 2 week(s).   Specialty:  General Surgery Why:  follow up diverticulitis Contact information: [place] [phone]          Signed: Clayburn Pert 01/12/2017, 9:08 AM

## 2017-01-13 ENCOUNTER — Telehealth: Payer: Self-pay

## 2017-01-16 NOTE — Telephone Encounter (Signed)
Was unable to leave a message at this time for patient to call back with any questions or concerns.

## 2017-01-18 ENCOUNTER — Telehealth: Payer: Self-pay

## 2017-01-18 NOTE — Telephone Encounter (Signed)
Patient called wanting to know if he could see Dr. Burt Knack than his scheduled appointment on 9/6. He stated that he started having constant diarrhea on Monday and wanted to know if he could be seen. I offered patient to be seen sooner by another provider and he declined and stated that he would keep his appointment with Dr. Burt Knack. I did advise patient to go to the ED if symptoms do not get any better. Patient verbalized understanding and had no further questions.

## 2017-01-19 ENCOUNTER — Ambulatory Visit (INDEPENDENT_AMBULATORY_CARE_PROVIDER_SITE_OTHER): Payer: 59 | Admitting: General Surgery

## 2017-01-19 ENCOUNTER — Encounter: Payer: Self-pay | Admitting: General Surgery

## 2017-01-19 ENCOUNTER — Inpatient Hospital Stay
Admission: AD | Admit: 2017-01-19 | Discharge: 2017-01-19 | Disposition: A | Discharge status: Home / Self Care | Payer: 59 | Source: Ambulatory Visit | Attending: General Surgery | Admitting: General Surgery

## 2017-01-19 ENCOUNTER — Other Ambulatory Visit
Admission: RE | Admit: 2017-01-19 | Discharge: 2017-01-19 | Disposition: A | Discharge status: Home / Self Care | Payer: 59 | Source: Ambulatory Visit | Attending: General Surgery | Admitting: General Surgery

## 2017-01-19 ENCOUNTER — Telehealth: Payer: Self-pay

## 2017-01-19 ENCOUNTER — Other Ambulatory Visit: Payer: Self-pay | Admitting: Surgery

## 2017-01-19 ENCOUNTER — Inpatient Hospital Stay
Admission: AD | Admit: 2017-01-19 | Discharge: 2017-01-26 | DRG: 392 | Disposition: A | Discharge status: Home / Self Care | Payer: 59 | Source: Intra-hospital | Attending: Surgery | Admitting: Surgery

## 2017-01-19 ENCOUNTER — Telehealth: Payer: Self-pay | Admitting: General Practice

## 2017-01-19 DIAGNOSIS — R1030 Lower abdominal pain, unspecified: Secondary | ICD-10-CM

## 2017-01-19 DIAGNOSIS — K5730 Diverticulosis of large intestine without perforation or abscess without bleeding: Secondary | ICD-10-CM | POA: Diagnosis not present

## 2017-01-19 DIAGNOSIS — K5792 Diverticulitis of intestine, part unspecified, without perforation or abscess without bleeding: Secondary | ICD-10-CM | POA: Diagnosis not present

## 2017-01-19 DIAGNOSIS — K5732 Diverticulitis of large intestine without perforation or abscess without bleeding: Secondary | ICD-10-CM

## 2017-01-19 DIAGNOSIS — F17290 Nicotine dependence, other tobacco product, uncomplicated: Secondary | ICD-10-CM | POA: Diagnosis present

## 2017-01-19 DIAGNOSIS — K5780 Diverticulitis of intestine, part unspecified, with perforation and abscess without bleeding: Principal | ICD-10-CM | POA: Diagnosis present

## 2017-01-19 DIAGNOSIS — A0472 Enterocolitis due to Clostridium difficile, not specified as recurrent: Secondary | ICD-10-CM | POA: Diagnosis present

## 2017-01-19 DIAGNOSIS — Z791 Long term (current) use of non-steroidal anti-inflammatories (NSAID): Secondary | ICD-10-CM | POA: Diagnosis not present

## 2017-01-19 DIAGNOSIS — Z85820 Personal history of malignant melanoma of skin: Secondary | ICD-10-CM | POA: Diagnosis not present

## 2017-01-19 DIAGNOSIS — Z8619 Personal history of other infectious and parasitic diseases: Secondary | ICD-10-CM

## 2017-01-19 DIAGNOSIS — N1330 Unspecified hydronephrosis: Secondary | ICD-10-CM | POA: Diagnosis not present

## 2017-01-19 DIAGNOSIS — R109 Unspecified abdominal pain: Secondary | ICD-10-CM | POA: Diagnosis not present

## 2017-01-19 HISTORY — DX: Malignant (primary) neoplasm, unspecified: C80.1

## 2017-01-19 HISTORY — DX: Diverticulitis of intestine, part unspecified, without perforation or abscess without bleeding: K57.92

## 2017-01-19 LAB — COMPREHENSIVE METABOLIC PANEL
ALT: 96 U/L — ABNORMAL HIGH (ref 17–63)
ANION GAP: 9 (ref 5–15)
AST: 52 U/L — AB (ref 15–41)
Albumin: 3.6 g/dL (ref 3.5–5.0)
Alkaline Phosphatase: 56 U/L (ref 38–126)
BILIRUBIN TOTAL: 1 mg/dL (ref 0.3–1.2)
BUN: 10 mg/dL (ref 6–20)
CHLORIDE: 104 mmol/L (ref 101–111)
CO2: 24 mmol/L (ref 22–32)
Calcium: 9 mg/dL (ref 8.9–10.3)
Creatinine, Ser: 0.89 mg/dL (ref 0.61–1.24)
Glucose, Bld: 101 mg/dL — ABNORMAL HIGH (ref 65–99)
Potassium: 3.7 mmol/L (ref 3.5–5.1)
Sodium: 137 mmol/L (ref 135–145)
TOTAL PROTEIN: 7.6 g/dL (ref 6.5–8.1)

## 2017-01-19 LAB — CBC WITH DIFFERENTIAL/PLATELET
Basophils Absolute: 0 10*3/uL (ref 0–0.1)
Basophils Relative: 0 %
EOS PCT: 1 %
Eosinophils Absolute: 0.1 10*3/uL (ref 0–0.7)
HEMATOCRIT: 43.2 % (ref 40.0–52.0)
Hemoglobin: 14.5 g/dL (ref 13.0–18.0)
LYMPHS ABS: 1.1 10*3/uL (ref 1.0–3.6)
LYMPHS PCT: 11 %
MCH: 27.9 pg (ref 26.0–34.0)
MCHC: 33.6 g/dL (ref 32.0–36.0)
MCV: 83.1 fL (ref 80.0–100.0)
MONO ABS: 0.8 10*3/uL (ref 0.2–1.0)
MONOS PCT: 8 %
Neutro Abs: 8 10*3/uL — ABNORMAL HIGH (ref 1.4–6.5)
Neutrophils Relative %: 80 %
PLATELETS: 331 10*3/uL (ref 150–440)
RBC: 5.2 MIL/uL (ref 4.40–5.90)
RDW: 13.7 % (ref 11.5–14.5)
WBC: 10.1 10*3/uL (ref 3.8–10.6)

## 2017-01-19 MED ORDER — HEPARIN SODIUM (PORCINE) 5000 UNIT/ML IJ SOLN
5000.0000 [IU] | Freq: Three times a day (TID) | INTRAMUSCULAR | Status: DC
  Administered 2017-01-19 – 2017-01-25 (×19): 5000 [IU] via SUBCUTANEOUS
  Filled 2017-01-19 (×20): qty 1

## 2017-01-19 MED ORDER — ONDANSETRON HCL 4 MG/2ML IJ SOLN
4.0000 mg | Freq: Four times a day (QID) | INTRAMUSCULAR | Status: DC | PRN
  Administered 2017-01-20 – 2017-01-25 (×2): 4 mg via INTRAVENOUS
  Filled 2017-01-19 (×2): qty 2

## 2017-01-19 MED ORDER — CIPROFLOXACIN IN D5W 400 MG/200ML IV SOLN
400.0000 mg | Freq: Two times a day (BID) | INTRAVENOUS | Status: DC
  Administered 2017-01-19 – 2017-01-25 (×12): 400 mg via INTRAVENOUS
  Filled 2017-01-19 (×13): qty 200

## 2017-01-19 MED ORDER — IOPAMIDOL (ISOVUE-300) INJECTION 61%
125.0000 mL | Freq: Once | INTRAVENOUS | Status: AC | PRN
  Administered 2017-01-19: 125 mL via INTRAVENOUS

## 2017-01-19 MED ORDER — LACTATED RINGERS IV SOLN
INTRAVENOUS | Status: DC
  Administered 2017-01-19 – 2017-01-20 (×2): via INTRAVENOUS
  Administered 2017-01-20: 1000 mL via INTRAVENOUS
  Administered 2017-01-21 – 2017-01-24 (×7): via INTRAVENOUS

## 2017-01-19 MED ORDER — ONDANSETRON HCL 4 MG PO TABS
4.0000 mg | ORAL_TABLET | Freq: Four times a day (QID) | ORAL | Status: DC | PRN
  Administered 2017-01-22: 4 mg via ORAL
  Filled 2017-01-19: qty 1

## 2017-01-19 MED ORDER — HYDROCODONE-ACETAMINOPHEN 5-325 MG PO TABS
1.0000 | ORAL_TABLET | ORAL | Status: DC | PRN
  Administered 2017-01-19: 2 via ORAL
  Administered 2017-01-20: 1 via ORAL
  Administered 2017-01-20 – 2017-01-22 (×8): 2 via ORAL
  Filled 2017-01-19 (×3): qty 2
  Filled 2017-01-19: qty 1
  Filled 2017-01-19 (×6): qty 2

## 2017-01-19 MED ORDER — HYDROMORPHONE HCL 1 MG/ML IJ SOLN
1.0000 mg | INTRAMUSCULAR | Status: DC | PRN
  Administered 2017-01-21 (×2): 1 mg via INTRAVENOUS
  Filled 2017-01-19 (×2): qty 1

## 2017-01-19 MED ORDER — METRONIDAZOLE IN NACL 5-0.79 MG/ML-% IV SOLN
500.0000 mg | Freq: Three times a day (TID) | INTRAVENOUS | Status: DC
  Administered 2017-01-19 – 2017-01-25 (×17): 500 mg via INTRAVENOUS
  Filled 2017-01-19 (×19): qty 100

## 2017-01-19 NOTE — Patient Instructions (Addendum)
We will send you to the [facility] a CT Scan and for stool testing this afternoon. I will call you with the results and plan going forward.  Stop your Augmentin today.  Directions to Medical Mall: When leaving our office, go right. Go all of the way down to the very end of the hallway. You will have a purple wall in front of you. You will now have a tunnel to the hospital on your left hand side. Go through this tunnel and the elevators will be on your left. Go down to the 1st floor and take a slight left. The very first desk on the right hand side is the registration desk.

## 2017-01-19 NOTE — Progress Notes (Signed)
Outpatient Surgical Follow Up  01/19/2017  Scott Aguilar is an 36 y.o. male.   Chief Complaint  Patient presents with  . Follow-up    Diverticulitis    HPI: 36 year old male still on ABX for diverticulitis. Reports pain has continued and is now associated with diarrhea. Has had multiple loose water bowel movements over the past 2 days. No formed stools. Pain still in left lower abdomen. Denies fevers or chills but is having subjective nausea without vomiting.   Past Medical History:  Diagnosis Date  . Asthma     Past Surgical History:  Procedure Laterality Date  . MELANOMA EXCISION  2013   Dr. Evorn Gong    Family History  Problem Relation Age of Onset  . Heart disease Father     Social History:  reports that he has never smoked. His smokeless tobacco use includes Snuff. He reports that he does not drink alcohol or use drugs.  Allergies: No Known Allergies  Medications reviewed.    ROS Multipoint ROS completed, all pertinent positives are in the HPI, the remainder are negative   BP (!) 152/85   Pulse 85   Temp 98.3 F (36.8 C) (Oral)   Ht _0  (1.88 m)   Wt (!) 175.3 kg (386 lb 8 oz)   BMI 49.62 kg/m   Physical Exam  Gen: NAD Chest: CTA CV: RRR Abd: Large, Soft, mildling TTP in LLQ, nondistended   Results for orders placed or performed during the hospital encounter of 01/19/17 (from the past 48 hour(s))  CBC with Differential/Platelet     Status: Abnormal   Collection Time: 01/19/17 12:24 PM  Result Value Ref Range   WBC 10.1 3.8 - 10.6 K/uL   RBC 5.20 4.40 - 5.90 MIL/uL   Hemoglobin 14.5 13.0 - 18.0 g/dL   HCT 43.2 40.0 - 52.0 %   MCV 83.1 80.0 - 100.0 fL   MCH 27.9 26.0 - 34.0 pg   MCHC 33.6 32.0 - 36.0 g/dL   RDW 13.7 11.5 - 14.5 %   Platelets 331 150 - 440 K/uL   Neutrophils Relative % 80 %   Neutro Abs 8.0 (H) 1.4 - 6.5 K/uL   Lymphocytes Relative 11 %   Lymphs Abs 1.1 1.0 - 3.6 K/uL   Monocytes Relative 8 %   Monocytes Absolute 0.8 0.2 - 1.0  K/uL   Eosinophils Relative 1 %   Eosinophils Absolute 0.1 0 - 0.7 K/uL   Basophils Relative 0 %   Basophils Absolute 0.0 0 - 0.1 K/uL  Comprehensive metabolic panel     Status: Abnormal   Collection Time: 01/19/17 12:24 PM  Result Value Ref Range   Sodium 137 135 - 145 mmol/L   Potassium 3.7 3.5 - 5.1 mmol/L   Chloride 104 101 - 111 mmol/L   CO2 24 22 - 32 mmol/L   Glucose, Bld 101 (H) 65 - 99 mg/dL   BUN 10 6 - 20 mg/dL   Creatinine, Ser 0.89 0.61 - 1.24 mg/dL   Calcium 9.0 8.9 - 10.3 mg/dL   Total Protein 7.6 6.5 - 8.1 g/dL   Albumin 3.6 3.5 - 5.0 g/dL   AST 52 (H) 15 - 41 U/L   ALT 96 (H) 17 - 63 U/L   Alkaline Phosphatase 56 38 - 126 U/L   Total Bilirubin 1.0 0.3 - 1.2 mg/dL   GFR calc non Af Amer >60 >60 mL/min   GFR calc Af Amer >60 >60 mL/min  Comment: (NOTE) The eGFR has been calculated using the CKD EPI equation. This calculation has not been validated in all clinical situations. eGFR's persistently <60 mL/min signify possible Chronic Kidney Disease.    Anion gap 9 5 - 15   No results found.  Assessment/Plan:  1. Lower abdominal pain 36 year old male still on ABX for diverticulitis. Given high volume of diarrhea I am worried about C Diff. But given persistent LLQ tenderness and pain I will also repeat images for his diverticulitis. Both possible diagnoses discussed with patient. We will order outpatient workup and call with results. Should he fail outpatient managmenet he understands the indications for admission. - CT Abdomen Pelvis W Contrast; Future - Clostridium Difficile by PCR; Future  A total of 15 minutes was used on this encounter with greater than 50% of the time used on counseling or coordination of care.   Clayburn Pert, MD FACS General Surgeon  01/19/2017,3:17 PM

## 2017-01-19 NOTE — Progress Notes (Unsigned)
Scott Aguilar is an 36 y.o. male.    Chief Complaint: Lower abdominal pain  HPI: This patient with known diverticulitis recently discharged from the hospital on oral antibiotics. He was seen in the office today by Dr. Adonis Huguenin who felt that the patient was not progressing as well as the expected and the patient complained of some lower abdominal pain. He states that pain is not nearly as bad as it was last week but is still present. He thinks he may have had some fevers and took his temperature but was only 99.  We were called by CT scan suggesting that he has an abscess. I met the patient in the CT waiting area. No physical exam was done at this time but a full physical exam had been done by Dr. Adonis Huguenin earlier today see his note as well.  Past Medical History:  Diagnosis Date  . Asthma   . Cancer ([place])    melanoma    Past Surgical History:  Procedure Laterality Date  . MELANOMA EXCISION  2013   Dr. Evorn Gong    Family History  Problem Relation Age of Onset  . Heart disease Father    Social History:  reports that he has never smoked. His smokeless tobacco use includes Snuff. He reports that he does not drink alcohol or use drugs.  Allergies: No Known Allergies   (Not in a hospital admission)   Review of Systems  Constitutional: Positive for chills, fever and malaise/fatigue. Negative for weight loss.  HENT: Negative.   Eyes: Negative.   Respiratory: Negative.   Cardiovascular: Negative.   Gastrointestinal: Positive for abdominal pain and diarrhea. Negative for blood in stool, constipation, heartburn, melena, nausea and vomiting.  Genitourinary: Negative.   Musculoskeletal: Negative.   Skin: Negative.   Neurological: Negative.   Endo/Heme/Allergies: Negative.   Psychiatric/Behavioral: Negative.      Physical Exam:  There were no vitals taken for this visit.  Physical Exam  Physical exam not performed at this time as there is no appropriate area for  examination.  Patient had been examined by Dr. Adonis Huguenin in the office earlier today please see that note.    Results for orders placed or performed during the hospital encounter of 01/19/17 (from the past 48 hour(s))  CBC with Differential/Platelet     Status: Abnormal   Collection Time: 01/19/17 12:24 PM  Result Value Ref Range   WBC 10.1 3.8 - 10.6 K/uL   RBC 5.20 4.40 - 5.90 MIL/uL   Hemoglobin 14.5 13.0 - 18.0 g/dL   HCT 43.2 40.0 - 52.0 %   MCV 83.1 80.0 - 100.0 fL   MCH 27.9 26.0 - 34.0 pg   MCHC 33.6 32.0 - 36.0 g/dL   RDW 13.7 11.5 - 14.5 %   Platelets 331 150 - 440 K/uL   Neutrophils Relative % 80 %   Neutro Abs 8.0 (H) 1.4 - 6.5 K/uL   Lymphocytes Relative 11 %   Lymphs Abs 1.1 1.0 - 3.6 K/uL   Monocytes Relative 8 %   Monocytes Absolute 0.8 0.2 - 1.0 K/uL   Eosinophils Relative 1 %   Eosinophils Absolute 0.1 0 - 0.7 K/uL   Basophils Relative 0 %   Basophils Absolute 0.0 0 - 0.1 K/uL  Comprehensive metabolic panel     Status: Abnormal   Collection Time: 01/19/17 12:24 PM  Result Value Ref Range   Sodium 137 135 - 145 mmol/L   Potassium 3.7 3.5 - 5.1 mmol/L  Chloride 104 101 - 111 mmol/L   CO2 24 22 - 32 mmol/L   Glucose, Bld 101 (H) 65 - 99 mg/dL   BUN 10 6 - 20 mg/dL   Creatinine, Ser 0.89 0.61 - 1.24 mg/dL   Calcium 9.0 8.9 - 10.3 mg/dL   Total Protein 7.6 6.5 - 8.1 g/dL   Albumin 3.6 3.5 - 5.0 g/dL   AST 52 (H) 15 - 41 U/L   ALT 96 (H) 17 - 63 U/L   Alkaline Phosphatase 56 38 - 126 U/L   Total Bilirubin 1.0 0.3 - 1.2 mg/dL   GFR calc non Af Amer >60 >60 mL/min   GFR calc Af Amer >60 >60 mL/min    Comment: (NOTE) The eGFR has been calculated using the CKD EPI equation. This calculation has not been validated in all clinical situations. eGFR's persistently <60 mL/min signify possible Chronic Kidney Disease.    Anion gap 9 5 - 15   Ct Abdomen Pelvis W Contrast  Result Date: 01/19/2017 CLINICAL DATA:  Lower abdominal pain.  Constipation. EXAM: CT  ABDOMEN AND PELVIS WITH CONTRAST TECHNIQUE: Multidetector CT imaging of the abdomen and pelvis was performed using the standard protocol following bolus administration of intravenous contrast. CONTRAST:  1105m ISOVUE-300 IOPAMIDOL (ISOVUE-300) INJECTION 61% COMPARISON:  01/07/2017. FINDINGS: Lower chest: Unremarkable. Hepatobiliary: No focal liver abnormality is seen. No gallstones, gallbladder wall thickening, or biliary dilatation. Pancreas: Unremarkable. No pancreatic ductal dilatation or surrounding inflammatory changes. Spleen: Normal in size without focal abnormality. Adrenals/Urinary Tract: Stable right renal cyst. Normal appearing adrenal glands, left kidney, ureters and urinary bladder. Stomach/Bowel: Multiple sigmoid colon diverticula with wall thickening and pericolonic soft tissue stranding and interval extraluminal fluid and gas collections. Small hiatal hernia. Unremarkable small bowel. Normal appearing appendix. Vascular/Lymphatic: No arterial calcifications are aneurysm. Mildly prominent pelvic and left para-aortic lymph nodes including a mild increase in size of a left para-aortic node with a short axis diameter of 8 mm on image number 55 of series 2 today. Reproductive: Prostate is unremarkable. Other: Confluent fluid and gas collections in the inferior pelvis adjacent to the abnormal sigmoid colon, measuring 5.8 x 5.5 cm on coronal image number 72 and 4.9 cm in AP diameter on image number 94 of series 2. Large left inguinal hernia containing fat and smaller right inguinal hernia containing fat. Musculoskeletal: Lower thoracic spine degenerative changes and minimal lumbar spine degenerative changes. IMPRESSION: 1. Sigmoid diverticulosis with progressive changes of sigmoid diverticulitis with a 5.8 x 5.5 x 4.9 cm area of abscess formation/contained perforation in the inferior pelvis. Followup sigmoidoscopy is recommended to exclude a perforated sigmoid colon neoplasm. 2. Minimal reactive pelvic and  retroperitoneal adenopathy. 3. Large left inguinal hernia containing fat and smaller right inguinal hernia containing fat. Electronically Signed   By: SClaudie ReveringM.D.   On: 01/19/2017 16:25     Assessment/Plan  CT scan is personally reviewed. Patient is experiencing a complication of his known diverticulitis. He has perforation and abscess. I doubt that this will be amenable to CT-guided drainage but I will ask CT to evaluate for potential IR drainage. He will be started on IV antibiotics again and given clear liquids. I will also check a C. difficile as he has had diarrhea. He will be admitted the hospital with an expected stay of a few days.  RFlorene Glen MD, FACS

## 2017-01-19 NOTE — Telephone Encounter (Signed)
Crystal from CT called to give critical results. These results of CT abdomen and pelvis were given to myself and readback was completed. Dr. Burt Knack was immediately notified and would like to direct admit patient. Call to admitting was made and bed for admission was requested. Spoke with Shawn. Call was then returned to [place], spoke with [facility] and patient to inform of results and that he would be admitted. Patient and Suezanne Jacquet (CT tech) verbalizes my understanding.  Dr. Burt Knack is on his way to see patient in CT and then patient will be taken to Admitting/Registration desk to be admitted to hospital.

## 2017-01-19 NOTE — Telephone Encounter (Signed)
Patient is calling, he is complaining of only having one normal bowel movement with diarrhea , pain is coming back in the lower abdomen, pain level is at a 2, but said its very uncomfortable. Patient also said he is having some nausea but not vomiting yet. Patient is due to come in for a hospital follow up/new patient on 01/26/17 with Dr. Burt Knack for Diverticulitis. Please call patient and advice.

## 2017-01-19 NOTE — Telephone Encounter (Signed)
Call returned to patient. Patient has been having worsening abdominal pain since last night. Only 2 normal bowel movements since d/c. Nonformed stools at this time, green in color- mucous like. No foul odor per patient. Fever with Tmax 99.6 with sweats intermittently. Continuing Augmentin without missing dose.  Spoke with Dr. Adonis Huguenin. He has ordered CBC and CMP for patient to be done today and then see in office afterwards. Patient placed on schedule and informed of all instructions. He verbalizes understanding.

## 2017-01-19 NOTE — Progress Notes (Signed)
See Dr. Reginal Lutes notes for H&P. I had dictated a completion H&P from the CT waiting area after seeing the patient but it does not show up. It has the details of the reason for admission etc. today.

## 2017-01-20 ENCOUNTER — Encounter: Payer: Self-pay | Admitting: *Deleted

## 2017-01-20 DIAGNOSIS — Z8619 Personal history of other infectious and parasitic diseases: Secondary | ICD-10-CM

## 2017-01-20 DIAGNOSIS — A0472 Enterocolitis due to Clostridium difficile, not specified as recurrent: Secondary | ICD-10-CM

## 2017-01-20 LAB — CBC
HCT: 38.2 % — ABNORMAL LOW (ref 40.0–52.0)
Hemoglobin: 13.4 g/dL (ref 13.0–18.0)
MCH: 28.6 pg (ref 26.0–34.0)
MCHC: 35.1 g/dL (ref 32.0–36.0)
MCV: 81.6 fL (ref 80.0–100.0)
PLATELETS: 283 10*3/uL (ref 150–440)
RBC: 4.68 MIL/uL (ref 4.40–5.90)
RDW: 13.7 % (ref 11.5–14.5)
WBC: 8.1 10*3/uL (ref 3.8–10.6)

## 2017-01-20 LAB — C DIFFICILE QUICK SCREEN W PCR REFLEX
C DIFFICILE (CDIFF) TOXIN: NEGATIVE
C Diff antigen: POSITIVE — AB

## 2017-01-20 LAB — CLOSTRIDIUM DIFFICILE BY PCR: Toxigenic C. Difficile by PCR: POSITIVE — AB

## 2017-01-20 MED ORDER — VANCOMYCIN 50 MG/ML ORAL SOLUTION: 125.0000 mg | ORAL | Status: DC

## 2017-01-20 MED ORDER — VANCOMYCIN 50 MG/ML ORAL SOLUTION: 125.0000 mg | Freq: Two times a day (BID) | ORAL | Status: DC

## 2017-01-20 MED ORDER — VANCOMYCIN 50 MG/ML ORAL SOLUTION: 125.0000 mg | Freq: Every day | ORAL | Status: DC

## 2017-01-20 MED ORDER — VANCOMYCIN 50 MG/ML ORAL SOLUTION
125.0000 mg | Freq: Four times a day (QID) | ORAL | Status: DC
  Administered 2017-01-20 – 2017-01-26 (×26): 125 mg via ORAL
  Filled 2017-01-20 (×29): qty 2.5

## 2017-01-20 MED ORDER — BOOST / RESOURCE BREEZE PO LIQD
1.0000 | Freq: Three times a day (TID) | ORAL | Status: DC
  Administered 2017-01-20 – 2017-01-23 (×9): 1 via ORAL

## 2017-01-20 NOTE — Progress Notes (Signed)
Initial Nutrition Assessment  DOCUMENTATION CODES:   Obesity unspecified  INTERVENTION:   Boost Breeze po TID, each supplement provides 250 kcal and 9 grams of protein  RD will order chocolate Premier Protein when diet advanced   NUTRITION DIAGNOSIS:   Inadequate oral intake related to acute illness as evidenced by meal completion < 25%.  GOAL:   Patient will meet greater than or equal to 90% of their needs  MONITOR:   PO intake, Supplement acceptance, Labs, Weight trends  REASON FOR ASSESSMENT:   Malnutrition Screening Tool    ASSESSMENT:   36 y/o male admitted for recurrent diverticulitis    Met with pt in room today. RD familiar with this pt from previous admit two weeks ago. Pt received  education regarding following a diverticulitis diet at that time. Pt reports that he has been mainly eating crackers, Gatorade, and drinking Premier Protein. Pt has not been able to return to a normal diet since discharge. Pt was tolerating diet at discharge. Pt with continued poor appetite and oral intake today. Pt did drink some water this morning. Pt is well aware of his need for protein and requests Premier Protein when diet advanced; until then, will order Boost Breeze. Per chart, pt with 14lb(4%) wt loss in two weeks; this is significant. Pt also noted to have diarrhea and is C-Diff positive.       Medications reviewed and include: heparin, vancomycin, ciprofloxacin, metronidazole, LRS @150ml /hr, hydrocodone  Labs reviewed:   Nutrition-Focused physical exam completed. Findings are no fat depletion, no muscle depletion, and no edema.   Diet Order:  Diet clear liquid Room service appropriate? Yes; Fluid consistency: Thin  Skin:  Reviewed, no issues  Last BM:  8/31  Height:   Ht Readings from Last 1 Encounters:  01/19/17 6' 2"  (1.88 m)    Weight:   Wt Readings from Last 1 Encounters:  01/19/17 (!) 382 lb 8 oz (173.5 kg)    Ideal Body Weight:  86.3 kg  BMI:  Body  mass index is 49.11 kg/m.  Estimated Nutritional Needs:   Kcal:  2400-2700kcal/day   Protein:  >140g/day   Fluid:  >2.4L/day   EDUCATION NEEDS:   Education needs addressed  Koleen Distance MS, RD, LDN Pager #[phone] After Hours Pager: [phone]

## 2017-01-20 NOTE — H&P (Signed)
Scott Aguilar is an 36 y.o. male.    Chief Complaint: Lower abdominal pain  HPI: Note that this is a redictation. I cannot find a dictation that I did prior to the patient being formally admitted when I had seen him in the CT waiting area. This is a redictation of an H&P from memory.  Patient seen in the CT scan area and then reexamined later in the day in the hospital. He was sent over from the office with signs of diarrhea and worsening abdominal pain in a patient with known diverticulitis. The CT suggested small abscess.  Patient thinks he might have had a fever before coming to the hospital and definitely had some chills. He had some nausea but no emesis and his pain is in the lower abdominal abdomen he was admitted to the hospital last week and sent home on oral antibiotics but has returned.  Past Medical History:  Diagnosis Date  . Asthma    Childhood asthma  . Cancer [facility]) 2013   melanoma back left shoulder    Past Surgical History:  Procedure Laterality Date  . MELANOMA EXCISION  2013   Dr. Evorn Gong    Family History  Problem Relation Age of Onset  . Heart disease Father    Social History:  reports that he has never smoked. His smokeless tobacco use includes Snuff. He reports that he does not drink alcohol or use drugs.  Allergies: No Known Allergies  Medications Prior to Admission  Medication Sig Dispense Refill  . amoxicillin (AMOXIL) 500 MG capsule Take 500 mg by mouth 3 (three) times daily.    Marland Kitchen amoxicillin-clavulanate (AUGMENTIN) 875-125 MG tablet Take 1 tablet by mouth 2 (two) times daily.    Marland Kitchen ibuprofen (ADVIL,MOTRIN) 200 MG tablet Take 400 mg by mouth every 6 (six) hours as needed.    . ondansetron (ZOFRAN) 4 MG tablet Take 4 mg by mouth every 8 (eight) hours as needed for nausea or vomiting.       Review of Systems  Constitutional: Positive for chills and fever.  HENT: Negative.   Eyes: Negative.   Respiratory: Negative.   Cardiovascular: Negative.    Gastrointestinal: Positive for abdominal pain, diarrhea and nausea. Negative for blood in stool, constipation, heartburn, melena and vomiting.  Genitourinary: Negative.   Musculoskeletal: Negative.   Skin: Negative.   Neurological: Negative.   Endo/Heme/Allergies: Negative.   Psychiatric/Behavioral: Negative.      Physical Exam:  BP 114/60 (BP Location: Right Arm)   Pulse 79   Temp 98.6 F (37 C) (Oral)   Resp 20   Ht 6\' 2"  (1.88 m)   Wt (!) 382 lb 8 oz (173.5 kg)   SpO2 99%   BMI 49.11 kg/m   Physical Exam  Constitutional: He is oriented to person, place, and time. No distress.  Obese and very large male patient  HENT:  Head: Normocephalic and atraumatic.  Eyes: Pupils are equal, round, and reactive to light.  Neck: Normal range of motion. No JVD present.  Cardiovascular: Normal rate and regular rhythm.   Pulmonary/Chest: Effort normal. No respiratory distress.  Abdominal: Soft. He exhibits no distension. There is tenderness. There is no rebound and no guarding.  Musculoskeletal: Normal range of motion. He exhibits no edema or tenderness.  Lymphadenopathy:    He has no cervical adenopathy.  Neurological: He is alert and oriented to person, place, and time.  Skin: Skin is warm and dry. No rash noted. He is not diaphoretic. No erythema.  Psychiatric: Mood and affect normal.  Vitals reviewed.       Results for orders placed or performed during the hospital encounter of 01/19/17 (from the past 48 hour(s))  C difficile quick scan w PCR reflex     Status: Abnormal   Collection Time: 01/20/17  2:58 AM  Result Value Ref Range   C Diff antigen POSITIVE (A) NEGATIVE   C Diff toxin NEGATIVE NEGATIVE   C Diff interpretation Results are indeterminate. See PCR results.   Clostridium Difficile by PCR     Status: Abnormal   Collection Time: 01/20/17  2:58 AM  Result Value Ref Range   Toxigenic C Difficile by pcr POSITIVE (A) NEGATIVE    Comment: Positive for toxigenic C.  difficile with little to no toxin production. Only treat if clinical presentation suggests symptomatic illness.  CBC     Status: Abnormal   Collection Time: 01/20/17  4:03 AM  Result Value Ref Range   WBC 8.1 3.8 - 10.6 K/uL   RBC 4.68 4.40 - 5.90 MIL/uL   Hemoglobin 13.4 13.0 - 18.0 g/dL   HCT 38.2 (L) 40.0 - 52.0 %   MCV 81.6 80.0 - 100.0 fL   MCH 28.6 26.0 - 34.0 pg   MCHC 35.1 32.0 - 36.0 g/dL   RDW 13.7 11.5 - 14.5 %   Platelets 283 150 - 440 K/uL   Ct Abdomen Pelvis W Contrast  Result Date: 01/19/2017 CLINICAL DATA:  Lower abdominal pain.  Constipation. EXAM: CT ABDOMEN AND PELVIS WITH CONTRAST TECHNIQUE: Multidetector CT imaging of the abdomen and pelvis was performed using the standard protocol following bolus administration of intravenous contrast. CONTRAST:  130mL ISOVUE-300 IOPAMIDOL (ISOVUE-300) INJECTION 61% COMPARISON:  01/07/2017. FINDINGS: Lower chest: Unremarkable. Hepatobiliary: No focal liver abnormality is seen. No gallstones, gallbladder wall thickening, or biliary dilatation. Pancreas: Unremarkable. No pancreatic ductal dilatation or surrounding inflammatory changes. Spleen: Normal in size without focal abnormality. Adrenals/Urinary Tract: Stable right renal cyst. Normal appearing adrenal glands, left kidney, ureters and urinary bladder. Stomach/Bowel: Multiple sigmoid colon diverticula with wall thickening and pericolonic soft tissue stranding and interval extraluminal fluid and gas collections. Small hiatal hernia. Unremarkable small bowel. Normal appearing appendix. Vascular/Lymphatic: No arterial calcifications are aneurysm. Mildly prominent pelvic and left para-aortic lymph nodes including a mild increase in size of a left para-aortic node with a short axis diameter of 8 mm on image number 55 of series 2 today. Reproductive: Prostate is unremarkable. Other: Confluent fluid and gas collections in the inferior pelvis adjacent to the abnormal sigmoid colon, measuring 5.8 x 5.5  cm on coronal image number 72 and 4.9 cm in AP diameter on image number 94 of series 2. Large left inguinal hernia containing fat and smaller right inguinal hernia containing fat. Musculoskeletal: Lower thoracic spine degenerative changes and minimal lumbar spine degenerative changes. IMPRESSION: 1. Sigmoid diverticulosis with progressive changes of sigmoid diverticulitis with a 5.8 x 5.5 x 4.9 cm area of abscess formation/contained perforation in the inferior pelvis. Followup sigmoidoscopy is recommended to exclude a perforated sigmoid colon neoplasm. 2. Minimal reactive pelvic and retroperitoneal adenopathy. 3. Large left inguinal hernia containing fat and smaller right inguinal hernia containing fat. Electronically Signed   By: Claudie Revering M.D.   On: 01/19/2017 16:25     Assessment/Plan  CT scan was personally reviewed.  Small abscess that does not appear amenable to drainage.  This a patient with known diverticulitis and now with a small abscess. He will be readmitted to the hospital  for IV antibiotics and a C. difficile toxin has been ordered . Patient was understanding of this plan.  Florene Glen, MD, FACS

## 2017-01-20 NOTE — Progress Notes (Signed)
CC: Diverticulitis Subjective: This patient readmitted yesterday with CT findings of a small abscess that does not appear to be amenable to drainage. He states he is feeling a little bit worse today with slight increase in pain but otherwise no nausea vomiting and no fevers. He did test positive for C. difficile. He is having Diarrhea  Objective: Vital signs in last 24 hours: Temp:  [98.3 F (36.8 C)-98.6 F (37 C)] 98.6 F (37 C) (08/31 0431) Pulse Rate:  [79-100] 79 (08/31 0431) Resp:  [18-20] 20 (08/31 0431) BP: (114-152)/(60-101) 114/60 (08/31 0431) SpO2:  [97 %-99 %] 99 % (08/31 0431) Weight:  [382 lb 8 oz (173.5 kg)-386 lb 8 oz (175.3 kg)] 382 lb 8 oz (173.5 kg) (08/30 1757) Last BM Date: 01/19/17  Intake/Output from previous day: 08/30 0701 - 08/31 0700 In: 2251 [P.O.:300; I.V.:1556; IV Piggyback:395] Out: 0  Intake/Output this shift: No intake/output data recorded.  Physical exam:  Awake alert and oriented vital signs are stable and afebrile No acute distress. Abdomen is soft and minimally tender in the left lower quadrant without peritoneal signs nontender calves no icterus no jaundice  Lab Results: CBC   Recent Labs  01/19/17 1224 01/20/17 0403  WBC 10.1 8.1  HGB 14.5 13.4  HCT 43.2 38.2*  PLT 331 283   BMET  Recent Labs  01/19/17 1224  NA 137  K 3.7  CL 104  CO2 24  GLUCOSE 101*  BUN 10  CREATININE 0.89  CALCIUM 9.0   PT/INR No results for input(s): LABPROT, INR in the last 72 hours. ABG No results for input(s): PHART, HCO3 in the last 72 hours.  Invalid input(s): PCO2, PO2  Studies/Results: Ct Abdomen Pelvis W Contrast  Result Date: 01/19/2017 CLINICAL DATA:  Lower abdominal pain.  Constipation. EXAM: CT ABDOMEN AND PELVIS WITH CONTRAST TECHNIQUE: Multidetector CT imaging of the abdomen and pelvis was performed using the standard protocol following bolus administration of intravenous contrast. CONTRAST:  142mL ISOVUE-300 IOPAMIDOL  (ISOVUE-300) INJECTION 61% COMPARISON:  01/07/2017. FINDINGS: Lower chest: Unremarkable. Hepatobiliary: No focal liver abnormality is seen. No gallstones, gallbladder wall thickening, or biliary dilatation. Pancreas: Unremarkable. No pancreatic ductal dilatation or surrounding inflammatory changes. Spleen: Normal in size without focal abnormality. Adrenals/Urinary Tract: Stable right renal cyst. Normal appearing adrenal glands, left kidney, ureters and urinary bladder. Stomach/Bowel: Multiple sigmoid colon diverticula with wall thickening and pericolonic soft tissue stranding and interval extraluminal fluid and gas collections. Small hiatal hernia. Unremarkable small bowel. Normal appearing appendix. Vascular/Lymphatic: No arterial calcifications are aneurysm. Mildly prominent pelvic and left para-aortic lymph nodes including a mild increase in size of a left para-aortic node with a short axis diameter of 8 mm on image number 55 of series 2 today. Reproductive: Prostate is unremarkable. Other: Confluent fluid and gas collections in the inferior pelvis adjacent to the abnormal sigmoid colon, measuring 5.8 x 5.5 cm on coronal image number 72 and 4.9 cm in AP diameter on image number 94 of series 2. Large left inguinal hernia containing fat and smaller right inguinal hernia containing fat. Musculoskeletal: Lower thoracic spine degenerative changes and minimal lumbar spine degenerative changes. IMPRESSION: 1. Sigmoid diverticulosis with progressive changes of sigmoid diverticulitis with a 5.8 x 5.5 x 4.9 cm area of abscess formation/contained perforation in the inferior pelvis. Followup sigmoidoscopy is recommended to exclude a perforated sigmoid colon neoplasm. 2. Minimal reactive pelvic and retroperitoneal adenopathy. 3. Large left inguinal hernia containing fat and smaller right inguinal hernia containing fat. Electronically Signed  By: Claudie Revering M.D.   On: 01/19/2017 16:25    Anti-infectives: Anti-infectives     Start     Dose/Rate Route Frequency Ordered Stop   02/25/17 1000  vancomycin (VANCOCIN) 50 mg/mL oral solution 125 mg     125 mg Oral Every 3 DAYS 01/20/17 0703 03/06/17 0959   02/17/17 1000  vancomycin (VANCOCIN) 50 mg/mL oral solution 125 mg     125 mg Oral Every other day 01/20/17 0703 02/25/17 0959   02/10/17 1000  vancomycin (VANCOCIN) 50 mg/mL oral solution 125 mg     125 mg Oral Daily 01/20/17 0703 02/17/17 0959   02/03/17 1000  vancomycin (VANCOCIN) 50 mg/mL oral solution 125 mg     125 mg Oral 2 times daily 01/20/17 0703 02/10/17 0959   01/20/17 1000  vancomycin (VANCOCIN) 50 mg/mL oral solution 125 mg     125 mg Oral 4 times daily 01/20/17 0703 02/03/17 0959   01/19/17 1930  ciprofloxacin (CIPRO) IVPB 400 mg     400 mg 200 mL/hr over 60 Minutes Intravenous Every 12 hours 01/19/17 1808     01/19/17 1930  metroNIDAZOLE (FLAGYL) IVPB 500 mg     500 mg 100 mL/hr over 60 Minutes Intravenous Every 8 hours 01/19/17 1808        Assessment/Plan:  This a patient with acute diverticulitis with a small abscess that is not amenable to drainage. He is on Cipro Flagyl and I've added vancomycin oral for his C. difficile positivity with diarrhea. He will be a observed and possibly rescanned in the next 2-3 days.  Florene Glen, MD, FACS  01/20/2017

## 2017-01-21 MED ORDER — SIMETHICONE 80 MG PO CHEW
80.0000 mg | CHEWABLE_TABLET | Freq: Four times a day (QID) | ORAL | Status: DC | PRN
  Administered 2017-01-21: 80 mg via ORAL
  Filled 2017-01-21 (×2): qty 1

## 2017-01-21 NOTE — Progress Notes (Signed)
CC: Acute perforated diverticulitis Subjective:  Acute perforated diverticulitis he failed outpatient therapy and is been readmitted with an abscess. That abscess is not amenable to CT-guided drainage. This is due to location and size.  Today the patient feels a little bit better but still has abdominal pain no nausea or vomiting. He is also been diagnosed with C. difficile colitis but has had no further diarrhea since being in the hospital. Denies fevers or chills  Objective: Vital signs in last 24 hours: Temp:  [98.2 F (36.8 C)-98.8 F (37.1 C)] 98.2 F (36.8 C) (09/01 0429) Pulse Rate:  [81-93] 81 (09/01 0429) Resp:  [20] 20 (09/01 0429) BP: (112-114)/(61-69) 114/69 (09/01 0429) SpO2:  [97 %-98 %] 98 % (09/01 0429) Last BM Date: 01/20/17  Intake/Output from previous day: 08/31 0701 - 09/01 0700 In: 1540 [I.V.:1240; IV Piggyback:300] Out: 2630 [Urine:2630] Intake/Output this shift: Total I/O In: -  Out: 100 [Urine:100]  Physical exam:  Soft abdomen minimally tender in the left lower quadrant no erythema no drainage  Vital signs are stable and reviewed Nontender calves Obese male patient  Lab Results: CBC   Recent Labs  01/19/17 1224 01/20/17 0403  WBC 10.1 8.1  HGB 14.5 13.4  HCT 43.2 38.2*  PLT 331 283   BMET  Recent Labs  01/19/17 1224  NA 137  K 3.7  CL 104  CO2 24  GLUCOSE 101*  BUN 10  CREATININE 0.89  CALCIUM 9.0   PT/INR No results for input(s): LABPROT, INR in the last 72 hours. ABG No results for input(s): PHART, HCO3 in the last 72 hours.  Invalid input(s): PCO2, PO2  Studies/Results: Ct Abdomen Pelvis W Contrast  Result Date: 01/19/2017 CLINICAL DATA:  Lower abdominal pain.  Constipation. EXAM: CT ABDOMEN AND PELVIS WITH CONTRAST TECHNIQUE: Multidetector CT imaging of the abdomen and pelvis was performed using the standard protocol following bolus administration of intravenous contrast. CONTRAST:  158mL ISOVUE-300 IOPAMIDOL  (ISOVUE-300) INJECTION 61% COMPARISON:  01/07/2017. FINDINGS: Lower chest: Unremarkable. Hepatobiliary: No focal liver abnormality is seen. No gallstones, gallbladder wall thickening, or biliary dilatation. Pancreas: Unremarkable. No pancreatic ductal dilatation or surrounding inflammatory changes. Spleen: Normal in size without focal abnormality. Adrenals/Urinary Tract: Stable right renal cyst. Normal appearing adrenal glands, left kidney, ureters and urinary bladder. Stomach/Bowel: Multiple sigmoid colon diverticula with wall thickening and pericolonic soft tissue stranding and interval extraluminal fluid and gas collections. Small hiatal hernia. Unremarkable small bowel. Normal appearing appendix. Vascular/Lymphatic: No arterial calcifications are aneurysm. Mildly prominent pelvic and left para-aortic lymph nodes including a mild increase in size of a left para-aortic node with a short axis diameter of 8 mm on image number 55 of series 2 today. Reproductive: Prostate is unremarkable. Other: Confluent fluid and gas collections in the inferior pelvis adjacent to the abnormal sigmoid colon, measuring 5.8 x 5.5 cm on coronal image number 72 and 4.9 cm in AP diameter on image number 94 of series 2. Large left inguinal hernia containing fat and smaller right inguinal hernia containing fat. Musculoskeletal: Lower thoracic spine degenerative changes and minimal lumbar spine degenerative changes. IMPRESSION: 1. Sigmoid diverticulosis with progressive changes of sigmoid diverticulitis with a 5.8 x 5.5 x 4.9 cm area of abscess formation/contained perforation in the inferior pelvis. Followup sigmoidoscopy is recommended to exclude a perforated sigmoid colon neoplasm. 2. Minimal reactive pelvic and retroperitoneal adenopathy. 3. Large left inguinal hernia containing fat and smaller right inguinal hernia containing fat. Electronically Signed   By: Percell Locus.D.  On: 01/19/2017 16:25    Anti-infectives: Anti-infectives     Start     Dose/Rate Route Frequency Ordered Stop   02/25/17 1000  vancomycin (VANCOCIN) 50 mg/mL oral solution 125 mg     125 mg Oral Every 3 DAYS 01/20/17 0703 03/06/17 0959   02/17/17 1000  vancomycin (VANCOCIN) 50 mg/mL oral solution 125 mg     125 mg Oral Every other day 01/20/17 0703 02/25/17 0959   02/10/17 1000  vancomycin (VANCOCIN) 50 mg/mL oral solution 125 mg     125 mg Oral Daily 01/20/17 0703 02/17/17 0959   02/03/17 1000  vancomycin (VANCOCIN) 50 mg/mL oral solution 125 mg     125 mg Oral 2 times daily 01/20/17 0703 02/10/17 0959   01/20/17 1000  vancomycin (VANCOCIN) 50 mg/mL oral solution 125 mg     125 mg Oral 4 times daily 01/20/17 0703 02/03/17 0959   01/19/17 1930  ciprofloxacin (CIPRO) IVPB 400 mg     400 mg 200 mL/hr over 60 Minutes Intravenous Every 12 hours 01/19/17 1808     01/19/17 1930  metroNIDAZOLE (FLAGYL) IVPB 500 mg     500 mg 100 mL/hr over 60 Minutes Intravenous Every 8 hours 01/19/17 1808        Assessment/Plan:  Patient doing well on IV antibiotics. We will advance diet slowly and consider repeating a CT scan in 1-2 days.  Florene Glen, MD, FACS  01/21/2017

## 2017-01-21 NOTE — Progress Notes (Signed)
Called Dr. Dahlia Byes regarding medication to relieve gas.  Appropriate orders were placed.  Christene Slates  01/21/2017  9:02 PM

## 2017-01-22 MED ORDER — MORPHINE SULFATE (PF) 2 MG/ML IV SOLN
2.0000 mg | INTRAVENOUS | Status: DC | PRN
  Administered 2017-01-22: 2 mg via INTRAVENOUS
  Filled 2017-01-22: qty 1

## 2017-01-22 MED ORDER — OXYCODONE-ACETAMINOPHEN 5-325 MG PO TABS
1.0000 | ORAL_TABLET | ORAL | Status: DC | PRN
  Administered 2017-01-22 – 2017-01-23 (×2): 2 via ORAL
  Filled 2017-01-22 (×2): qty 2

## 2017-01-22 NOTE — Progress Notes (Signed)
CC: Perforated diverticulitis Subjective: This patient with perforated diverticulitis and small abscess not amenable to CT-guided drainage. He also has C. difficile colitis. He is being treated for both.  He states that he has minimal pain today his biggest complaint is that of difficulty with his IV last night. He also states that his pain medication is too strong.  Denies fevers or chills and is tolerating a full liquid diet.  Objective: Vital signs in last 24 hours: Temp:  [98 F (36.7 C)-99.2 F (37.3 C)] 98.2 F (36.8 C) (09/02 0515) Pulse Rate:  [78-100] 78 (09/02 0515) Resp:  [18-20] 18 (09/02 0515) BP: (102-126)/(52-79) 102/52 (09/02 0515) SpO2:  [96 %-99 %] 96 % (09/02 0515) Last BM Date: 01/22/17  Intake/Output from previous day: 09/01 0701 - 09/02 0700 In: 1207 [I.V.:1207] Out: 2600 [Urine:2600] Intake/Output this shift: Total I/O In: -  Out: 75 [Urine:75]  Physical exam:  Awake alert and oriented vital signs are stable and afebrile abdomen is soft and minimally tender in the left lower quadrant without peritoneal signs. Calves are nontender  Lab Results: CBC   Recent Labs  01/19/17 1224 01/20/17 0403  WBC 10.1 8.1  HGB 14.5 13.4  HCT 43.2 38.2*  PLT 331 283   BMET  Recent Labs  01/19/17 1224  NA 137  K 3.7  CL 104  CO2 24  GLUCOSE 101*  BUN 10  CREATININE 0.89  CALCIUM 9.0   PT/INR No results for input(s): LABPROT, INR in the last 72 hours. ABG No results for input(s): PHART, HCO3 in the last 72 hours.  Invalid input(s): PCO2, PO2  Studies/Results: No results found.  Anti-infectives: Anti-infectives    Start     Dose/Rate Route Frequency Ordered Stop   02/25/17 1000  vancomycin (VANCOCIN) 50 mg/mL oral solution 125 mg     125 mg Oral Every 3 DAYS 01/20/17 0703 03/06/17 0959   02/17/17 1000  vancomycin (VANCOCIN) 50 mg/mL oral solution 125 mg     125 mg Oral Every other day 01/20/17 0703 02/25/17 0959   02/10/17 1000  vancomycin  (VANCOCIN) 50 mg/mL oral solution 125 mg     125 mg Oral Daily 01/20/17 0703 02/17/17 0959   02/03/17 1000  vancomycin (VANCOCIN) 50 mg/mL oral solution 125 mg     125 mg Oral 2 times daily 01/20/17 0703 02/10/17 0959   01/20/17 1000  vancomycin (VANCOCIN) 50 mg/mL oral solution 125 mg     125 mg Oral 4 times daily 01/20/17 0703 02/03/17 0959   01/19/17 1930  ciprofloxacin (CIPRO) IVPB 400 mg     400 mg 200 mL/hr over 60 Minutes Intravenous Every 12 hours 01/19/17 1808     01/19/17 1930  metroNIDAZOLE (FLAGYL) IVPB 500 mg     500 mg 100 mL/hr over 60 Minutes Intravenous Every 8 hours 01/19/17 1808        Assessment/Plan:  White count is normal  Will adjust pain medication continue on full liquid diet. Consider CT scan in the next day or 2.  Florene Glen, MD, FACS  01/22/2017

## 2017-01-23 ENCOUNTER — Inpatient Hospital Stay: Payer: 59

## 2017-01-23 LAB — CBC WITH DIFFERENTIAL/PLATELET
Basophils Absolute: 0 10*3/uL (ref 0–0.1)
Basophils Relative: 1 %
EOS ABS: 0.1 10*3/uL (ref 0–0.7)
Eosinophils Relative: 2 %
HEMATOCRIT: 37 % — AB (ref 40.0–52.0)
HEMOGLOBIN: 12.6 g/dL — AB (ref 13.0–18.0)
LYMPHS ABS: 1.2 10*3/uL (ref 1.0–3.6)
Lymphocytes Relative: 20 %
MCH: 28.3 pg (ref 26.0–34.0)
MCHC: 34.2 g/dL (ref 32.0–36.0)
MCV: 83 fL (ref 80.0–100.0)
Monocytes Absolute: 0.6 10*3/uL (ref 0.2–1.0)
Monocytes Relative: 10 %
NEUTROS ABS: 4 10*3/uL (ref 1.4–6.5)
NEUTROS PCT: 67 %
Platelets: 301 10*3/uL (ref 150–440)
RBC: 4.45 MIL/uL (ref 4.40–5.90)
RDW: 13.7 % (ref 11.5–14.5)
WBC: 6 10*3/uL (ref 3.8–10.6)

## 2017-01-23 LAB — BASIC METABOLIC PANEL
Anion gap: 7 (ref 5–15)
BUN: 6 mg/dL (ref 6–20)
CHLORIDE: 105 mmol/L (ref 101–111)
CO2: 28 mmol/L (ref 22–32)
Calcium: 8.5 mg/dL — ABNORMAL LOW (ref 8.9–10.3)
Creatinine, Ser: 0.85 mg/dL (ref 0.61–1.24)
GFR calc non Af Amer: >60 mL/min (ref >60)
Glucose, Bld: 110 mg/dL — ABNORMAL HIGH (ref 65–99)
POTASSIUM: 3.4 mmol/L — AB (ref 3.5–5.1)
SODIUM: 140 mmol/L (ref 135–145)

## 2017-01-23 MED ORDER — IOPAMIDOL (ISOVUE-300) INJECTION 61%
30.0000 mL | Freq: Once | INTRAVENOUS | Status: AC | PRN
  Administered 2017-01-23: 30 mL via ORAL

## 2017-01-23 MED ORDER — POTASSIUM CHLORIDE CRYS ER 20 MEQ PO TBCR
40.0000 meq | EXTENDED_RELEASE_TABLET | Freq: Once | ORAL | Status: AC
  Administered 2017-01-23: 40 meq via ORAL
  Filled 2017-01-23: qty 2

## 2017-01-23 MED ORDER — IOPAMIDOL (ISOVUE-300) INJECTION 61%
125.0000 mL | Freq: Once | INTRAVENOUS | Status: AC | PRN
  Administered 2017-01-23: 125 mL via INTRAVENOUS

## 2017-01-23 NOTE — Progress Notes (Signed)
01/23/2017  Subjective: No acute events overnight.  Reports still having frequent diarrhea, but that sometimes the stool is bulkier compared to prior.  Has intermittent low abdominal discomfort.  No worsening with full liquid diet yesterday.  Vital signs: Temp:  [97.9 F (36.6 C)-98 F (36.7 C)] 97.9 F (36.6 C) (09/03 0448) Pulse Rate:  [74-84] 84 (09/03 0448) Resp:  [17-18] 18 (09/03 0448) BP: (100-113)/(54-57) 113/57 (09/03 0448) SpO2:  [95 %-97 %] 95 % (09/03 0448)   Intake/Output: 09/02 0701 - 09/03 0700 In: 5532.2 [P.O.:240; I.V.:4592.2; IV Piggyback:700] Out: 2675 [Urine:2675] Last BM Date: 01/22/17  Physical Exam: Constitutional: No acute distress Abdomen:  Soft, obese, non distended, with mild tenderness to palpation over the low abdomen/pelvis.  No peritoneal signs.  Labs:   Recent Labs  01/23/17 0322  WBC 6.0  HGB 12.6*  HCT 37.0*  PLT 301    Recent Labs  01/23/17 0322  NA 140  K 3.4*  CL 105  CO2 28  GLUCOSE 110*  BUN 6  CREATININE 0.85  CALCIUM 8.5*   No results for input(s): LABPROT, INR in the last 72 hours.  Imaging: No results found.  Assessment/Plan: 36 yo male with acute diverticulitis with abscess, complicated by c-diff colitis.  --continue cipro/flagyl for diverticulitis --continue oral vancomycin for c-diff colitis. --will obtain new CT scan of abdomen/pelvis to evaluate the area of questionable abscess on prior CT scan on 8/30.  Discussed with patient that if there is a more organized collection, would discuss with radiology about possible drainage. --OOB, ambulate in room   [facility] [place], [place]

## 2017-01-24 LAB — CBC WITH DIFFERENTIAL/PLATELET
BASOS ABS: 0 10*3/uL (ref 0–0.1)
Basophils Relative: 1 %
Eosinophils Absolute: 0.1 10*3/uL (ref 0–0.7)
Eosinophils Relative: 3 %
HCT: 37.8 % — ABNORMAL LOW (ref 40.0–52.0)
HEMOGLOBIN: 12.9 g/dL — AB (ref 13.0–18.0)
LYMPHS ABS: 1 10*3/uL (ref 1.0–3.6)
LYMPHS PCT: 23 %
MCH: 27.8 pg (ref 26.0–34.0)
MCHC: 34.2 g/dL (ref 32.0–36.0)
MCV: 81.4 fL (ref 80.0–100.0)
Monocytes Absolute: 0.4 10*3/uL (ref 0.2–1.0)
Monocytes Relative: 9 %
NEUTROS ABS: 2.8 10*3/uL (ref 1.4–6.5)
Neutrophils Relative %: 64 %
PLATELETS: 355 10*3/uL (ref 150–440)
RBC: 4.64 MIL/uL (ref 4.40–5.90)
RDW: 13.8 % (ref 11.5–14.5)
WBC: 4.3 10*3/uL (ref 3.8–10.6)

## 2017-01-24 MED ORDER — PREMIER PROTEIN SHAKE
11.0000 [oz_av] | Freq: Two times a day (BID) | ORAL | Status: DC
  Administered 2017-01-24 – 2017-01-25 (×4): 11 [oz_av] via ORAL

## 2017-01-24 MED ORDER — RISAQUAD PO CAPS
2.0000 | ORAL_CAPSULE | Freq: Three times a day (TID) | ORAL | Status: DC
  Administered 2017-01-24: 2 via ORAL
  Filled 2017-01-24: qty 2

## 2017-01-24 MED ORDER — RISAQUAD PO CAPS
2.0000 | ORAL_CAPSULE | Freq: Every day | ORAL | Status: DC
  Administered 2017-01-25 – 2017-01-26 (×2): 2 via ORAL
  Filled 2017-01-24 (×2): qty 2

## 2017-01-24 MED ORDER — DIPHENHYDRAMINE HCL 25 MG PO CAPS
25.0000 mg | ORAL_CAPSULE | Freq: Every evening | ORAL | Status: DC | PRN
  Administered 2017-01-24: 25 mg via ORAL
  Filled 2017-01-24: qty 1

## 2017-01-24 NOTE — Progress Notes (Signed)
Nutrition Follow Up Note   DOCUMENTATION CODES:   Obesity unspecified  INTERVENTION:   Premier Protein BID, each supplement provides 160kcal and 30g protein.   NUTRITION DIAGNOSIS:   Inadequate oral intake related to acute illness as evidenced by meal completion < 25%.  -resolving  GOAL:   Patient will meet greater than or equal to 90% of their needs   MONITOR:   PO intake, Supplement acceptance, Labs, Weight trends  ASSESSMENT:   36 y/o male admitted for recurrent diverticulitis and C-Diff positive   Pt doing well; eating 100% of meals. Pt advanced to soft diet today. No new weight since admit. Will switch pt from [facility] to [place] per pt request. Per MD note, abscess improved. Pt previously educated regarding diverticulitis diet. No new questions or concerns at this time. Pt with low potassium 9/3; continue to monitor and supplement as needed per MD discretion. Pt with continued diarrhea but improved per pt report; initiated on probiotics today.        Medications reviewed and include: acidophilus, heparin, vancomycin, ciprofloxacin, metronidazole  Labs reviewed: K 3.4(L), Ca 8.5(L)- 9/3  Diet Order:  DIET SOFT Room service appropriate? Yes; Fluid consistency: Thin  Skin:  Reviewed, no issues  Last BM:  9/3- diarrhea   Height:   Ht Readings from Last 1 Encounters:  01/19/17 6\' 2"  (1.88 m)    Weight:   Wt Readings from Last 1 Encounters:  01/19/17 (!) 382 lb 8 oz (173.5 kg)    Ideal Body Weight:  86.3 kg  BMI:  Body mass index is 49.11 kg/m.  Estimated Nutritional Needs:   Kcal:  2400-2700kcal/day   Protein:  >140g/day   Fluid:  >2.4L/day   EDUCATION NEEDS:   Education needs addressed  Koleen Distance MS, RD, LDN Pager #[phone] After Hours Pager: [phone]

## 2017-01-24 NOTE — Progress Notes (Signed)
01/24/2017  Subjective: No acute events.  Diarrhea mildly improved and pain better this morning.  CT scan obtained yesterday showed improved fluid in the low pelvis with no abscess at this point, however, with continued inflammation.  Vital signs: Temp:  [97.7 F (36.5 C)-98.7 F (37.1 C)] 97.7 F (36.5 C) (09/04 0504) Pulse Rate:  [64-81] 64 (09/04 0504) Resp:  [16-20] 16 (09/04 0504) BP: (101-124)/(48-77) 124/77 (09/04 0504) SpO2:  [97 %] 97 % (09/04 0504)   Intake/Output: 09/03 0701 - 09/04 0700 In: 3637.8 [P.O.:1410; I.V.:1827.8; IV Piggyback:400] Out: 2300 [Urine:2300] Last BM Date: 01/23/17  Physical Exam: Constitutional: No acute distress Abdomen:  Soft, nondistended, less tender to palpation in the low abdomen.  Labs:   Recent Labs  01/23/17 0322 01/24/17 0358  WBC 6.0 4.3  HGB 12.6* 12.9*  HCT 37.0* 37.8*  PLT 301 355    Recent Labs  01/23/17 0322  NA 140  K 3.4*  CL 105  CO2 28  GLUCOSE 110*  BUN 6  CREATININE 0.85  CALCIUM 8.5*   No results for input(s): LABPROT, INR in the last 72 hours.  Imaging: Ct Abdomen Pelvis W Contrast  Result Date: 01/23/2017 CLINICAL DATA:  Followup perforated diverticulitis and small abscess is not amenable to CT-guided drainage. EXAM: CT ABDOMEN AND PELVIS WITH CONTRAST TECHNIQUE: Multidetector CT imaging of the abdomen and pelvis was performed using the standard protocol following bolus administration of intravenous contrast. CONTRAST:  156mL ISOVUE-300 IOPAMIDOL (ISOVUE-300) INJECTION 61% COMPARISON:  None. FINDINGS: Lower chest: Unremarkable. Hepatobiliary: No focal liver abnormality is seen. No gallstones, gallbladder wall thickening, or biliary dilatation. Pancreas: Unremarkable. No pancreatic ductal dilatation or surrounding inflammatory changes. Spleen: Normal in size without focal abnormality. Adrenals/Urinary Tract: Stable right renal cyst. Interval minimal dilatation of the left renal collecting system and ureter to  the level of inflammatory changes in the mid pelvis. Unremarkable urinary bladder, right ureter and adrenal glands. Stomach/Bowel: Diffuse mid to distal sigmoid colon wall thickening without significant change. Mildly progressive pericolonic soft tissue stranding. Significant decrease in previously demonstrated adjacent fluid and gas collections with no measurable fluid or gas collection seen at this time. Multiple sigmoid colon diverticula are again noted. Unremarkable appendix, small bowel and stomach. Vascular/Lymphatic: No significant vascular findings are present. No enlarged abdominal or pelvic lymph nodes. Reproductive: No arterial calcifications are aneurysm. The previously demonstrated 8 mm short axis para-aortic node has a short axis diameter of 7 mm today on image number 52 of series 2. There are also mildly prominent distal left external iliac lymph nodes on the left. The largest has a short axis diameter of 11 mm on image number 92 of series 2, previously 6 mm. Other: Stable large left inguinal hernia containing fat and smaller right inguinal hernia containing fat. Musculoskeletal: Lower thoracic spine degenerative changes. IMPRESSION: 1. Improved extraluminal gas and fluid collections adjacent to the sigmoid colon with no measurable collections seen at this time. 2. Mildly progressive pericolonic soft tissue stranding in the sigmoid region with stable diffuse wall thickening, compatible with residual changes of acute diverticulitis. 3. Previously noted sigmoid diverticulosis. 4. Interval minimal left hydronephrosis and hydroureter due to secondary involvement of the ureter by inflammatory changes at the level of the sigmoid diverticulitis in the mid pelvis. 5. Interval mild reactive left external iliac adenopathy stable mild reactive left para-aortic adenopathy. Electronically Signed   By: Claudie Revering M.D.   On: 01/23/2017 12:48    Assessment/Plan: 36 yo male with diverticulitis and c-diff  colitis.  --  advance diet to soft diet today. --continue IV cipro and flagyl.  Likely change to oral tomorrow. --start probiotics today --oob, ambulate.   Melvyn Neth, [place]

## 2017-01-25 LAB — CBC WITH DIFFERENTIAL/PLATELET
Basophils Absolute: 0.1 10*3/uL (ref 0–0.1)
Basophils Relative: 1 %
EOS ABS: 0.2 10*3/uL (ref 0–0.7)
Eosinophils Relative: 4 %
HCT: 38.8 % — ABNORMAL LOW (ref 40.0–52.0)
HEMOGLOBIN: 13.3 g/dL (ref 13.0–18.0)
LYMPHS ABS: 1.1 10*3/uL (ref 1.0–3.6)
Lymphocytes Relative: 26 %
MCH: 28 pg (ref 26.0–34.0)
MCHC: 34.2 g/dL (ref 32.0–36.0)
MCV: 81.8 fL (ref 80.0–100.0)
Monocytes Absolute: 0.4 10*3/uL (ref 0.2–1.0)
Monocytes Relative: 10 %
Neutro Abs: 2.6 10*3/uL (ref 1.4–6.5)
Neutrophils Relative %: 59 %
Platelets: 356 10*3/uL (ref 150–440)
RBC: 4.75 MIL/uL (ref 4.40–5.90)
RDW: 13.8 % (ref 11.5–14.5)
WBC: 4.4 10*3/uL (ref 3.8–10.6)

## 2017-01-25 MED ORDER — DIPHENHYDRAMINE HCL 25 MG PO CAPS
50.0000 mg | ORAL_CAPSULE | Freq: Every evening | ORAL | Status: DC | PRN
  Administered 2017-01-25: 50 mg via ORAL

## 2017-01-25 MED ORDER — METRONIDAZOLE 500 MG PO TABS
500.0000 mg | ORAL_TABLET | Freq: Three times a day (TID) | ORAL | Status: DC
  Administered 2017-01-25 – 2017-01-26 (×4): 500 mg via ORAL
  Filled 2017-01-25 (×5): qty 1

## 2017-01-25 MED ORDER — DIPHENHYDRAMINE HCL 25 MG PO CAPS
ORAL_CAPSULE | ORAL | Status: AC
  Filled 2017-01-25: qty 2

## 2017-01-25 MED ORDER — CIPROFLOXACIN HCL 500 MG PO TABS
500.0000 mg | ORAL_TABLET | Freq: Two times a day (BID) | ORAL | Status: DC
  Administered 2017-01-25 – 2017-01-26 (×2): 500 mg via ORAL
  Filled 2017-01-25 (×2): qty 1

## 2017-01-25 NOTE — Progress Notes (Signed)
01/25/2017  Subjective: No acute events overnight. Patient reports that his pain is much better today. Also feels that his diarrhea is improving and is becoming bulkier.  Vital signs: Temp:  [97.9 F (36.6 C)-98.5 F (36.9 C)] 97.9 F (36.6 C) (09/05 0454) Pulse Rate:  [68-76] 68 (09/05 0454) Resp:  [18-20] 20 (09/05 0454) BP: (110-149)/(70-81) 149/74 (09/05 0454) SpO2:  [96 %-98 %] 97 % (09/05 0454)   Intake/Output: 09/04 0701 - 09/05 0700 In: 2286 [P.O.:300; I.V.:1586; IV Piggyback:400] Out: 3250 [Urine:3250] Last BM Date: 01/25/17  Physical Exam: Constitutional: No acute distress Abdomen:  Soft, nondistended, nontender to palpation.  Labs:   Recent Labs  01/24/17 0358 01/25/17 0402  WBC 4.3 4.4  HGB 12.9* 13.3  HCT 37.8* 38.8*  PLT 355 356    Recent Labs  01/23/17 0322  NA 140  K 3.4*  CL 105  CO2 28  GLUCOSE 110*  BUN 6  CREATININE 0.85  CALCIUM 8.5*   No results for input(s): LABPROT, INR in the last 72 hours.  Imaging: No results found.  Assessment/Plan: 36 year old male with diverticulitis with abscess complicated by C. difficile colitis.  -Change Cipro and Flagyl to oral versions today. -Continue oral vancomycin. -Discontinue IV fluids. -Possible discharge to home tomorrow.   Melvyn Neth, [place]

## 2017-01-26 ENCOUNTER — Ambulatory Visit: Payer: Self-pay | Admitting: Surgery

## 2017-01-26 ENCOUNTER — Telehealth: Payer: Self-pay

## 2017-01-26 LAB — CBC WITH DIFFERENTIAL/PLATELET
BASOS ABS: 0.1 10*3/uL (ref 0–0.1)
BASOS PCT: 1 %
Eosinophils Absolute: 0.2 10*3/uL (ref 0–0.7)
Eosinophils Relative: 3 %
HEMATOCRIT: 40.6 % (ref 40.0–52.0)
HEMOGLOBIN: 14.1 g/dL (ref 13.0–18.0)
Lymphocytes Relative: 23 %
Lymphs Abs: 1.3 10*3/uL (ref 1.0–3.6)
MCH: 28.3 pg (ref 26.0–34.0)
MCHC: 34.8 g/dL (ref 32.0–36.0)
MCV: 81.3 fL (ref 80.0–100.0)
MONO ABS: 0.5 10*3/uL (ref 0.2–1.0)
MONOS PCT: 8 %
NEUTROS ABS: 3.8 10*3/uL (ref 1.4–6.5)
NEUTROS PCT: 65 %
Platelets: 377 10*3/uL (ref 150–440)
RBC: 5 MIL/uL (ref 4.40–5.90)
RDW: 13.9 % (ref 11.5–14.5)
WBC: 5.8 10*3/uL (ref 3.8–10.6)

## 2017-01-26 MED ORDER — OXYCODONE-ACETAMINOPHEN 5-325 MG PO TABS: 1.0000 | ORAL_TABLET | ORAL | 0 refills | Status: DC | PRN

## 2017-01-26 MED ORDER — METRONIDAZOLE 500 MG PO TABS: 500.0000 mg | ORAL_TABLET | Freq: Three times a day (TID) | ORAL | 0 refills | Status: AC

## 2017-01-26 MED ORDER — RISAQUAD PO CAPS: 2.0000 | ORAL_CAPSULE | Freq: Every day | ORAL | 0 refills | Status: DC

## 2017-01-26 MED ORDER — CIPROFLOXACIN HCL 500 MG PO TABS: 500.0000 mg | ORAL_TABLET | Freq: Two times a day (BID) | ORAL | 0 refills | Status: AC

## 2017-01-26 MED ORDER — VANCOMYCIN 50 MG/ML ORAL SOLUTION
125.0000 mg | Freq: Four times a day (QID) | ORAL | 0 refills | Status: AC
Start: 2017-01-26 — End: 2017-02-16

## 2017-01-26 NOTE — Progress Notes (Signed)
Discharge instructions reviewed with patient and wife, including new medications.  Understanding was verbalized and all questions were answered.  Patient discharged home in stable condition ambulatory in stable condition.

## 2017-01-26 NOTE — Discharge Summary (Signed)
Patient ID: Scott Aguilar MRN: [id number] DOB/AGE: 02/03/1981 36 y.o.  Admit date: 01/19/2017 Discharge date: 01/26/2017   Discharge Diagnoses:  Active Problems:   Acute diverticulitis   Enteritis due to Clostridium difficile   Procedures: None  Hospital Course: Patient was admitted on 8/30 with diverticulitis scopic by C. difficile colitis. On his initial CT scan he was noted to have a possible abscess and fluid in the low pelvis near the area of inflammation. He was also having diarrhea of multiple bouts and tested positive for C. difficile. He was started on ciprofloxacin and Flagyl for diverticulitis as well as oral vancomycin for his colitis. His white blood cell count normalized and her diet was slowly advanced. Initially he was having significant diarrhea and multiple bouts per day and was kept on IV fluids for better hydration. He was also started on probiotics to help with his diarrhea control. Eventually his diarrhea has been improving and the stool has become bulkier. His abdominal pain resolved. Repeat CT scan on 9/3 showed resolution of his abscess but some persistent inflammation around the area of the diverticulitis. Clinically he continues to improve and will be discharged to home to complete an additional week of antibiotics for his reticularis to be followed by 2 additional weeks after that of vancomycin for his colitis. He will follow-up with Dr. Burt Knack as an outpatient.  Consults: None  Disposition: 01-Home or Self Care  Discharge Instructions    Activity as tolerated - No restrictions    Complete by:  As directed    Call MD for:  difficulty breathing, headache or visual disturbances    Complete by:  As directed    Call MD for:  persistant nausea and vomiting    Complete by:  As directed    Call MD for:  redness, tenderness, or signs of infection (pain, swelling, redness, odor or green/yellow discharge around incision site)    Complete by:  As directed    Call MD for:   severe uncontrolled pain    Complete by:  As directed    Call MD for:  temperature >100.4    Complete by:  As directed    Diet - low sodium heart healthy    Complete by:  As directed    Discharge instructions    Complete by:  As directed    1.  Take antibiotics as indicated 2.  May take probiotics for bowel support 3.  Do not take any anti-diarrhea medication   Increase activity slowly    Complete by:  As directed      Allergies as of 01/26/2017   No Known Allergies     Medication List    STOP taking these medications   amoxicillin 500 MG capsule Commonly known as:  AMOXIL   amoxicillin-clavulanate 875-125 MG tablet Commonly known as:  AUGMENTIN     TAKE these medications   acidophilus Caps capsule Take 2 capsules by mouth daily.   ciprofloxacin 500 MG tablet Commonly known as:  CIPRO Take 1 tablet (500 mg total) by mouth 2 (two) times daily.   ibuprofen 200 MG tablet Commonly known as:  ADVIL,MOTRIN Take 400 mg by mouth every 6 (six) hours as needed.   metroNIDAZOLE 500 MG tablet Commonly known as:  FLAGYL Take 1 tablet (500 mg total) by mouth every 8 (eight) hours.   ondansetron 4 MG tablet Commonly known as:  ZOFRAN Take 4 mg by mouth every 8 (eight) hours as needed for nausea or vomiting.  oxyCODONE-acetaminophen 5-325 MG tablet Commonly known as:  PERCOCET/ROXICET Take 1-2 tablets by mouth every 4 (four) hours as needed for moderate pain.   vancomycin 50 mg/mL oral solution Commonly known as:  VANCOCIN Take 2.5 mLs (125 mg total) by mouth 4 (four) times daily.            Discharge Care Instructions        Start     Ordered   01/27/17 0000  acidophilus (RISAQUAD) CAPS capsule  Daily     01/26/17 1253   01/26/17 0000  ciprofloxacin (CIPRO) 500 MG tablet  2 times daily     01/26/17 1253   01/26/17 0000  metroNIDAZOLE (FLAGYL) 500 MG tablet  Every 8 hours     01/26/17 1253   01/26/17 0000  oxyCODONE-acetaminophen (PERCOCET/ROXICET) 5-325 MG  tablet  Every 4 hours PRN     01/26/17 1253   01/26/17 0000  vancomycin (VANCOCIN) 50 mg/mL oral solution  4 times daily     01/26/17 1253   01/26/17 0000  Diet - low sodium heart healthy     01/26/17 1253   01/26/17 0000  Increase activity slowly     01/26/17 1253   01/26/17 0000  Call MD for:  temperature >100.4     01/26/17 1253   01/26/17 0000  Call MD for:  persistant nausea and vomiting     01/26/17 1253   01/26/17 0000  Call MD for:  severe uncontrolled pain     01/26/17 1253   01/26/17 0000  Call MD for:  redness, tenderness, or signs of infection (pain, swelling, redness, odor or green/yellow discharge around incision site)     01/26/17 1253   01/26/17 0000  Call MD for:  difficulty breathing, headache or visual disturbances     01/26/17 1253   01/26/17 0000  Discharge instructions    Comments:  1.  Take antibiotics as indicated 2.  May take probiotics for bowel support 3.  Do not take any anti-diarrhea medication   01/26/17 1253   01/26/17 0000  Activity as tolerated - No restrictions     01/26/17 1253     Follow-up Information    Florene Glen, MD. Go on 02/09/2017.   Specialty:  Surgery Why:  Dr. Burt Knack, Thursday, 9/20 at 9:00 a.m.  [phone] Contact information: [address] [place] [address] [phone]

## 2017-01-26 NOTE — Telephone Encounter (Signed)
[facility] called and asked for permission to change the oral solution to 125 mg tablets. I told them this change was okay and gave the information over to Amber to contact the patient.

## 2017-01-26 NOTE — Telephone Encounter (Signed)
Spoke with Dr. Hampton Abbot. He would like medication changed to tablets since liquid is unavailable.

## 2017-01-27 NOTE — Telephone Encounter (Signed)
Hospital Follow-up call made to patient at this time. Spoke with Scott Aguilar. Hospital Follow-up interview questions below.  1. How are you feeling? Feeling pretty good still a little weak  2. Is your pain controlled? Yes  3. What are you doing for the pain? No pain  4. Are you having any Nausea or Vomiting? Had some nausea in the morning taking some zofran which are helping  5. Are you having any Fever or Chills? Had some chills at one time did check his temp and it was 98  6. Are you having any Constipation or Diarrhea? Going more frequently but becoming more of a solid  7. Do you have any questions or concerns at this time? None   Discussion: Made patient aware of follow up appointment on 9/20 at 9:15 with Dr. Burt Knack Patient verbalized understanding at this time.

## 2017-01-31 ENCOUNTER — Telehealth: Payer: Self-pay | Admitting: General Surgery

## 2017-01-31 NOTE — Telephone Encounter (Signed)
Patient is having acid reflux and vomiting - please advise

## 2017-01-31 NOTE — Telephone Encounter (Signed)
Patient chewed some tums and they seemed to be helping.  He stated he had some abdominal cramping but he feels better since leaving the hospital He denies fever and chills  And he is having normal bowel movements. Patient instructed to call office if he worsens.

## 2017-02-06 ENCOUNTER — Other Ambulatory Visit
Admission: RE | Admit: 2017-02-06 | Discharge: 2017-02-06 | Disposition: A | Discharge status: Home / Self Care | Payer: 59 | Source: Ambulatory Visit | Attending: Surgery | Admitting: Surgery

## 2017-02-06 ENCOUNTER — Telehealth: Payer: Self-pay

## 2017-02-06 ENCOUNTER — Ambulatory Visit (INDEPENDENT_AMBULATORY_CARE_PROVIDER_SITE_OTHER): Payer: 59 | Admitting: Surgery

## 2017-02-06 ENCOUNTER — Encounter: Payer: Self-pay | Admitting: Surgery

## 2017-02-06 VITALS — BP 144/84 | HR 103 | Temp 98.1°F | Ht 74.0 in | Wt 379.5 lb

## 2017-02-06 DIAGNOSIS — K5732 Diverticulitis of large intestine without perforation or abscess without bleeding: Secondary | ICD-10-CM

## 2017-02-06 DIAGNOSIS — R1030 Lower abdominal pain, unspecified: Secondary | ICD-10-CM

## 2017-02-06 DIAGNOSIS — R109 Unspecified abdominal pain: Secondary | ICD-10-CM

## 2017-02-06 DIAGNOSIS — A0472 Enterocolitis due to Clostridium difficile, not specified as recurrent: Secondary | ICD-10-CM | POA: Diagnosis not present

## 2017-02-06 LAB — COMPREHENSIVE METABOLIC PANEL
ALT: 39 U/L (ref 17–63)
AST: 29 U/L (ref 15–41)
Albumin: 4.1 g/dL (ref 3.5–5.0)
Alkaline Phosphatase: 52 U/L (ref 38–126)
Anion gap: 9 (ref 5–15)
BUN: 12 mg/dL (ref 6–20)
CHLORIDE: 102 mmol/L (ref 101–111)
CO2: 26 mmol/L (ref 22–32)
CREATININE: 1.11 mg/dL (ref 0.61–1.24)
Calcium: 9.5 mg/dL (ref 8.9–10.3)
GFR calc non Af Amer: >60 mL/min (ref >60)
Glucose, Bld: 90 mg/dL (ref 65–99)
POTASSIUM: 4.4 mmol/L (ref 3.5–5.1)
SODIUM: 137 mmol/L (ref 135–145)
Total Bilirubin: 1.3 mg/dL — ABNORMAL HIGH (ref 0.3–1.2)
Total Protein: 7.7 g/dL (ref 6.5–8.1)

## 2017-02-06 NOTE — Telephone Encounter (Signed)
Speaking with patient now. He states he is having sharp pains in his abdomen. He denies fever, chills. He finished his Cipro and Flagyl however he is still taking Vancomycin. He states he is having a bowel movements at least once daily, however small amount. His pain is causing nausea. Patient added to Dr.Cooper schedule today.

## 2017-02-06 NOTE — Patient Instructions (Signed)
We have scheduled you for a CT Scan of your Abdomen and Pelvis. This has been scheduled at 2:00pm at our [facility] location. Please Check-in at 1:45, 15 minutes prior to your scheduled appointment. If you need to reschedule your Scan, you may do so by calling [phone].  You will need to pick up a prep kit at least 24 hours in advance of your Scan: You may pick this up at the [place] department at [facility], or [employer].  Bring a list of medications with you to your appointment and you may have nothing to eat or drink 4 hours prior to your CT Scan.    We will call you with the results.  You may try Dulcolax suppositories over the counter to help with having a bowel movement.

## 2017-02-06 NOTE — Addendum Note (Signed)
Addended by: Lowella Curb on: 02/06/2017 02:34 PM   Modules accepted: Orders

## 2017-02-06 NOTE — Progress Notes (Signed)
Outpatient Surgical Follow Up  02/06/2017  Scott Aguilar is an 36 y.o. male.   CC: Acute diverticulitis  HPI: This patient readmitted the hospital with acute diverticulitis. He also has been treated for C. difficile colitis. With some difficulty I was able to determine that the patient is feeling better than he was last week. He had difficulty articulating "better versus worse". He denies fevers or chills has not had any further diarrhea and states that his pain is more centered in the mid lower abdomen rather than diffusely as it had been. Past Medical History:  Diagnosis Date  . Acute diverticulitis 01/19/2017  . Asthma    Childhood asthma  . Cancer ([place]) 2013   melanoma back left shoulder  . Diverticulitis 01/07/2017  . Enteritis due to Clostridium difficile     Past Surgical History:  Procedure Laterality Date  . MELANOMA EXCISION  2013   Dr. Evorn Gong    Family History  Problem Relation Age of Onset  . Heart disease Father     Social History:  reports that he has never smoked. His smokeless tobacco use includes Snuff. He reports that he does not drink alcohol or use drugs.  Allergies: No Known Allergies  Medications reviewed.   Review of Systems:   Review of Systems  Constitutional: Negative.   HENT: Negative.   Eyes: Negative.   Respiratory: Negative.   Cardiovascular: Negative.   Gastrointestinal: Positive for abdominal pain. Negative for blood in stool, constipation, diarrhea, heartburn, melena, nausea and vomiting.  Genitourinary: Negative.   Musculoskeletal: Negative.   Skin: Negative.   Neurological: Negative.   Endo/Heme/Allergies: Negative.   Psychiatric/Behavioral: Negative.      Physical Exam:  There were no vitals taken for this visit.  Physical Exam  Constitutional: He is oriented to person, place, and time and well-developed, well-nourished, and in no distress. No distress.  Morbidly obese in no acute distress appears comfortable  HENT:   Head: Normocephalic and atraumatic.  Eyes: Right eye exhibits no discharge. Left eye exhibits no discharge. No scleral icterus.  Neck: Normal range of motion. No JVD present.  Cardiovascular: Normal rate and regular rhythm.   Pulmonary/Chest: Effort normal. No respiratory distress.  Abdominal: Soft. He exhibits no distension. There is no tenderness. There is no rebound and no guarding.  Minimal if any tenderness in the suprapubic area certainly no peritoneal signs and certainly greatly improved over prior exams.  Musculoskeletal: Normal range of motion. He exhibits no edema or tenderness.  Neurological: He is alert and oriented to person, place, and time.  Skin: Skin is warm and dry. He is not diaphoretic. No erythema.  Psychiatric: Mood and affect normal.  Vitals reviewed.     No results found for this or any previous visit (from the past 48 hour(s)). No results found.  Assessment/Plan:  This a patient with acute diverticulitis with perforation and abscess. I would recommend repeating a CT scan t tomorrow as well as labs. The rationale for this was discussed with the patient and he understood and agreed with this plan.  I had difficulty determining if he was better or worse as he did not articulate this very well but with multiple questioning it appears that he is better and is not having subjective findings except the lower abdominal pain which is improved. If this is better and he is only being treated for C. difficile that is quite the improvement. Should he require readmission then a Hartman's procedure would be the best answer in  this patient. We will call him with results.  Florene Glen, MD, FACS

## 2017-02-07 ENCOUNTER — Ambulatory Visit
Admission: RE | Admit: 2017-02-07 | Discharge: 2017-02-07 | Disposition: A | Discharge status: Home / Self Care | Payer: 59 | Source: Ambulatory Visit | Attending: Surgery | Admitting: Surgery

## 2017-02-07 DIAGNOSIS — K4020 Bilateral inguinal hernia, without obstruction or gangrene, not specified as recurrent: Secondary | ICD-10-CM | POA: Insufficient documentation

## 2017-02-07 DIAGNOSIS — R109 Unspecified abdominal pain: Secondary | ICD-10-CM | POA: Insufficient documentation

## 2017-02-07 MED ORDER — IOPAMIDOL (ISOVUE-370) INJECTION 76%
125.0000 mL | Freq: Once | INTRAVENOUS | Status: AC | PRN
  Administered 2017-02-07: 125 mL via INTRAVENOUS

## 2017-02-09 ENCOUNTER — Encounter: Payer: Self-pay | Admitting: Surgery

## 2017-02-09 ENCOUNTER — Ambulatory Visit (INDEPENDENT_AMBULATORY_CARE_PROVIDER_SITE_OTHER): Payer: 59 | Admitting: Surgery

## 2017-02-09 ENCOUNTER — Telehealth: Payer: Self-pay

## 2017-02-09 VITALS — BP 138/90 | HR 78 | Temp 98.1°F | Wt 371.0 lb

## 2017-02-09 DIAGNOSIS — K5732 Diverticulitis of large intestine without perforation or abscess without bleeding: Secondary | ICD-10-CM

## 2017-02-09 DIAGNOSIS — A0472 Enterocolitis due to Clostridium difficile, not specified as recurrent: Secondary | ICD-10-CM

## 2017-02-09 NOTE — Progress Notes (Signed)
Outpatient Surgical Follow Up  02/09/2017  Scott Aguilar is an 36 y.o. male.   CC: Acute diverticulitis  HPI: Patient describes poor appetite and ongoing suprapubic pain. He has pain when he urinates but denies hematuria or foul smelling urine and no air in his urine. He has no fevers or chills. Overall he feels better than he did. He is tolerating a liquid diet at this point and asked questions about increasing his diet. He is having bowel movements which are normal at this point.  His CT scan is been personally reviewed demonstrating improvement and resolution of his abscess.  Past Medical History:  Diagnosis Date  . Acute diverticulitis 01/19/2017  . Asthma    Childhood asthma  . Cancer ([place]) 2013   melanoma back left shoulder  . Diverticulitis 01/07/2017  . Enteritis due to Clostridium difficile     Past Surgical History:  Procedure Laterality Date  . MELANOMA EXCISION  2013   Dr. Evorn Gong    Family History  Problem Relation Age of Onset  . Heart disease Father     Social History:  reports that he has never smoked. His smokeless tobacco use includes Snuff. He reports that he does not drink alcohol or use drugs.  Allergies: No Known Allergies  Medications reviewed.   Review of Systems:   Review of Systems  Constitutional: Negative for chills and fever.  HENT: Negative.   Eyes: Negative.   Respiratory: Negative.   Cardiovascular: Negative.   Gastrointestinal: Positive for abdominal pain. Negative for blood in stool, constipation, diarrhea, heartburn, melena, nausea and vomiting.  Genitourinary: Negative for dysuria, frequency, hematuria and urgency.  Musculoskeletal: Negative.   Skin: Negative.   Neurological: Negative.   Endo/Heme/Allergies: Negative.   Psychiatric/Behavioral: Negative.      Physical Exam:  BP 138/90   Pulse 78   Temp 98.1 F (36.7 C) (Oral)   Wt (!) 371 lb (168.3 kg)   BMI 47.63 kg/m   Physical Exam  Constitutional: He is  oriented to person, place, and time and well-developed, well-nourished, and in no distress. No distress.  HENT:  Head: Normocephalic and atraumatic.  Eyes: Pupils are equal, round, and reactive to light. Right eye exhibits no discharge. Left eye exhibits no discharge. No scleral icterus.  Neck: Normal range of motion. No JVD present.  Pulmonary/Chest: Effort normal. No respiratory distress.  Abdominal: Soft. He exhibits no distension. There is no tenderness. There is no rebound and no guarding.  Essentially nontender in the suprapubic area no peritoneal signs no mass.  Musculoskeletal: Normal range of motion. He exhibits no edema or tenderness.  Lymphadenopathy:    He has no cervical adenopathy.  Neurological: He is alert and oriented to person, place, and time.  Skin: Skin is warm and dry. He is not diaphoretic.  Tattoos  Psychiatric: Mood and affect normal.  Vitals reviewed.     No results found for this or any previous visit (from the past 48 hour(s)). Ct Abdomen Pelvis W Contrast  Result Date: 02/07/2017 CLINICAL DATA:  Lower abdominal pain. Pain with urination. Patient was discharged from the hospital 01/26/2017 after admission for diverticulitis. EXAM: CT ABDOMEN AND PELVIS WITH CONTRAST TECHNIQUE: Multidetector CT imaging of the abdomen and pelvis was performed using the standard protocol following bolus administration of intravenous contrast. CONTRAST:  125 cc Isovue 370. COMPARISON:  CT abdomen and pelvis 02/19/2017 and 01/19/2017. FINDINGS: Lower chest: Lung bases are clear. Heart size is normal. No pleural or pericardial effusion. Hepatobiliary: No focal  liver abnormality is seen. No gallstones, gallbladder wall thickening, or biliary dilatation. Pancreas: Unremarkable. No pancreatic ductal dilatation or surrounding inflammatory changes. Spleen: Normal in size without focal abnormality. Adrenals/Urinary Tract: The kidneys otherwise appear normal. Mild stranding is seen about the  distal left ureter. Mild hydronephrosis on the left seen on the most recent CT has resolved. The adrenal glands are unremarkable. Stomach/Bowel: Sigmoid diverticulosis with marked wall thickening and extensive stranding about the mid sigmoid colon is again seen. The appearance is somewhat improved compared to the most recent CT scan. No abscess or perforation is identified. The colon is otherwise normal appearance. The appendix appears normal. The stomach and small bowel are unremarkable. Vascular/Lymphatic: No significant vascular findings are present. No enlarged abdominal or pelvic lymph nodes. Reproductive: Prostate is unremarkable. Other: Bilateral fat containing inguinal hernias are seen. Hernia on the left is very large. The appearance is unchanged. Musculoskeletal: Negative. IMPRESSION: Extensive inflammatory change and wall thickening about the sigmoid colon persists but does show some improvement since the most recent CT scan. No abscess or perforation. Mild stranding about distal left ureter is likely secondary to sigmoid inflammatory change. Mild hydronephrosis on the left on the most recent CT scan has resolved. No change in bilateral fat containing inguinal hernias, very large on the left. Electronically Signed   By: Inge Rise M.D.   On: 02/07/2017 16:42    Assessment/Plan:  CT personally reviewed showing improvement and resolution of the prior abscess. Overall the patient seems to be improving. I'm hesitant to continue antibiotics for treatment of the diverticulitis as his C. difficile would be worsened. He has no further diarrhea nor is he having constipation.  At this point I have spoken to Dr. Adonis Huguenin concerning the potential for a laparoscopic approach to this patient once he is completely resolved and elective surgical intervention is considered. Patient will follow up with Dr. Adonis Huguenin for any surgical planning in the future as he is overall improved.  Florene Glen, MD,  FACS

## 2017-02-09 NOTE — Telephone Encounter (Signed)
FMLA faxed to [phone] with confirmation.  Patient notified.

## 2017-02-09 NOTE — Patient Instructions (Signed)
We will see you back in two weeks to make sure that you are doing better.  Please finish your antibiotic.

## 2017-02-15 ENCOUNTER — Telehealth: Payer: Self-pay | Admitting: General Practice

## 2017-02-15 DIAGNOSIS — R197 Diarrhea, unspecified: Secondary | ICD-10-CM

## 2017-02-15 NOTE — Telephone Encounter (Signed)
Spoke with patient at this time. He denies hematuria and air in urine. But has been having dysuria, frequency, and urgency. Denies increase in abdominal pain. States that he only has pain in his abdomen prior to having a bowel movement, then after passing BM, pain goes away completely. Complains of a dull constant pain at suprapubic area. Also complains of nausea but denies vomiting and has been able to keep down food and water. Is staying hydrated throughout the day. Patient is no longer on any antibiotics.  I spoke with Dr. Rosana Hoes about this patient. He would like to obtain a CBC, C-Diff stool, and UA on this patient STAT. Orders placed. States that he cannot come in today to have this done as he does not have transportation. He agrees to come in first thing in the morning and I will return phone call to him once I receive all results and speak with surgeon.  Patient is also requesting a note with start date of leave 01/07/17 and when he may return to work. I did explain that the return date is indeterminate at this time as he is still having symptoms but he may work from home if he is allowed to do this. He states that he was refused FMLA from employer because employer states that the company is too small and his job is in jeopardy. I did speak with him about this being a federal regulation that all employers must comply with and I will call employer if needed to explain this. He will speak with his employer once again tomorrow morning and let me know how he would like to proceed regarding this.

## 2017-02-15 NOTE — Telephone Encounter (Signed)
Patient called and is complaining of having diarrhea for three days, patient said he is having a little bit of nausea, lower abdomen pain, pain level being a 3, but the pain level elevates to a 5 or 6 when he has the bowel movement, also having pain when urinating. Please call patient and advice.

## 2017-02-16 ENCOUNTER — Other Ambulatory Visit
Admission: RE | Admit: 2017-02-16 | Discharge: 2017-02-16 | Disposition: A | Discharge status: Home / Self Care | Payer: 59 | Source: Ambulatory Visit | Attending: Surgery | Admitting: Surgery

## 2017-02-16 DIAGNOSIS — R197 Diarrhea, unspecified: Secondary | ICD-10-CM

## 2017-02-16 LAB — CBC WITH DIFFERENTIAL/PLATELET
BASOS ABS: 0.1 10*3/uL (ref 0–0.1)
Basophils Relative: 1 %
EOS ABS: 0.1 10*3/uL (ref 0–0.7)
Eosinophils Relative: 2 %
HCT: 44.5 % (ref 40.0–52.0)
HEMOGLOBIN: 15.2 g/dL (ref 13.0–18.0)
LYMPHS ABS: 1.1 10*3/uL (ref 1.0–3.6)
Lymphocytes Relative: 19 %
MCH: 27.6 pg (ref 26.0–34.0)
MCHC: 34.1 g/dL (ref 32.0–36.0)
MCV: 81 fL (ref 80.0–100.0)
Monocytes Absolute: 0.4 10*3/uL (ref 0.2–1.0)
Monocytes Relative: 6 %
NEUTROS PCT: 72 %
Neutro Abs: 4.1 10*3/uL (ref 1.4–6.5)
Platelets: 251 10*3/uL (ref 150–440)
RBC: 5.49 MIL/uL (ref 4.40–5.90)
RDW: 14.4 % (ref 11.5–14.5)
WBC: 5.7 10*3/uL (ref 3.8–10.6)

## 2017-02-16 LAB — URINALYSIS, ROUTINE W REFLEX MICROSCOPIC
BILIRUBIN URINE: NEGATIVE
Bacteria, UA: NONE SEEN
Glucose, UA: NEGATIVE mg/dL
Ketones, ur: NEGATIVE mg/dL
Leukocytes, UA: NEGATIVE
Nitrite: NEGATIVE
PROTEIN: 100 mg/dL — AB
Specific Gravity, Urine: 1.02 (ref 1.005–1.030)
pH: 5 (ref 5.0–8.0)

## 2017-02-16 LAB — C DIFFICILE QUICK SCREEN W PCR REFLEX
C DIFFICILE (CDIFF) INTERP: NOT DETECTED
C DIFFICILE (CDIFF) TOXIN: NEGATIVE
C DIFFICLE (CDIFF) ANTIGEN: NEGATIVE

## 2017-02-16 NOTE — Telephone Encounter (Signed)
Spoke with patient at this time. Reviewed CBC, UA, and C-Diff result. I explained to patient that he would follow-up as scheduled next week and call with any questions or concerns prior to his appointment. He states that he is having some nausea still today, he is taking Zofran, and this is helping. His abdominal pain and diarrhea is getting better today.   Work note has been faxed over to his employer [phone] at this time. Attn: Louellen Molder per patient's request. See letter written in chart.

## 2017-02-23 ENCOUNTER — Ambulatory Visit (INDEPENDENT_AMBULATORY_CARE_PROVIDER_SITE_OTHER): Payer: 59 | Admitting: General Surgery

## 2017-02-23 ENCOUNTER — Encounter: Payer: Self-pay | Admitting: General Surgery

## 2017-02-23 VITALS — BP 138/89 | HR 87 | Temp 98.7°F | Wt 365.0 lb

## 2017-02-23 DIAGNOSIS — K5792 Diverticulitis of intestine, part unspecified, without perforation or abscess without bleeding: Secondary | ICD-10-CM | POA: Diagnosis not present

## 2017-02-23 NOTE — Progress Notes (Signed)
Outpatient Surgical Follow Up  02/23/2017  Scott Aguilar is an 36 y.o. male.   Chief Complaint  Patient presents with  . Follow-up    Diverticulitis ED 01/07/17    HPI: 36 year old male but is well-known to the surgery service returns to clinic for follow-up of his diverticulitis. Patient reports that he completed his antibiotic therapy approximately 2 weeks ago and has continued to be pain-free. His stamina continues to improve and he is started to return to work. He continues to have difficulty with lifting secondary to decreased energy levels. He currently denies any fevers, chills, nausea, vomiting, chest pain, shortness breath, diarrhea, constipation. He reports he has never had a colonoscopy.  Past Medical History:  Diagnosis Date  . Acute diverticulitis 01/19/2017  . Asthma    Childhood asthma  . Cancer ([place]) 2013   melanoma back left shoulder  . Diverticulitis 01/07/2017  . Enteritis due to Clostridium difficile     Past Surgical History:  Procedure Laterality Date  . MELANOMA EXCISION  2013   Dr. Evorn Gong    Family History  Problem Relation Age of Onset  . Heart disease Father     Social History:  reports that he has never smoked. His smokeless tobacco use includes Snuff. He reports that he does not drink alcohol or use drugs.  Allergies: No Known Allergies  Medications reviewed.    ROS A multipoint review of systems was completed, all pertinent positives and negatives are documented within the history of present illness and the remainder are negative   BP 138/89   Pulse 87   Temp 98.7 F (37.1 C) (Oral)   Wt (!) 165.6 kg (365 lb)   BMI 46.86 kg/m   Physical Exam Gen.: No acute distress Neck: Supple and nontender Chest: Clear to auscultation Heart: Regular rhythm Abdomen: Large, soft, nontender    No results found for this or any previous visit (from the past 48 hour(s)). No results found.  Assessment/Plan:  1. Diverticulitis 36 year old  male status post, gated diverticulitis. Discussed the diagnosis in detail as well as the treatment of a laparoscopic sigmoid colectomy. Patient voiced understanding and is very interested in undergoing this procedure. However, discussed the importance of a colonoscopy prior in case he is one of the low percentage this is actually a tumor instead diverticulitis causing his problem. The rationale for this was described in detail and he voiced understanding. We will set him up for a colonoscopy and follow-up in clinic afterwards for further surgical planning.  A total of 15 minutes was used on this encounter. 50% of it used for counseling her coordination of care.   Clayburn Pert, MD FACS General Surgeon  02/23/2017,11:42 AM

## 2017-02-23 NOTE — Patient Instructions (Signed)
We will send the referral to Gastroenterology and they will call you with your appointment to schedule your colonoscopy. Then Dr. Adonis Huguenin will see you back after you have this done.

## 2017-03-23 ENCOUNTER — Telehealth: Payer: Self-pay | Admitting: General Practice

## 2017-03-23 ENCOUNTER — Other Ambulatory Visit: Payer: Self-pay

## 2017-03-23 NOTE — Telephone Encounter (Signed)
Patient instructed to call and speak with Ginger to see if he could be added to cancellation list or possibly get an earlier appointment.  Number to GI was provided [phone].

## 2017-03-23 NOTE — Telephone Encounter (Signed)
Patient is calling upset about his colonoscopy appointment and asked for someone to give him a call, we referred patient over. Patient is upset because it took over four weeks to just get in for a consultation, and now is going to have to wait even longer to have it done. Please call patient and advice.

## 2017-03-24 ENCOUNTER — Other Ambulatory Visit: Payer: Self-pay

## 2017-03-24 ENCOUNTER — Encounter: Payer: Self-pay | Admitting: Gastroenterology

## 2017-03-24 ENCOUNTER — Ambulatory Visit (INDEPENDENT_AMBULATORY_CARE_PROVIDER_SITE_OTHER): Payer: 59 | Admitting: Gastroenterology

## 2017-03-24 VITALS — BP 139/81 | HR 79 | Temp 98.2°F | Ht 74.0 in | Wt 364.2 lb

## 2017-03-24 DIAGNOSIS — K5732 Diverticulitis of large intestine without perforation or abscess without bleeding: Secondary | ICD-10-CM

## 2017-03-24 DIAGNOSIS — K5792 Diverticulitis of intestine, part unspecified, without perforation or abscess without bleeding: Secondary | ICD-10-CM

## 2017-03-24 NOTE — Progress Notes (Signed)
Scott Darby, MD [address]  [place]  [place], [place] [address]  Main: [phone]  Fax: [phone]    Gastroenterology Consultation  Referring Provider:     Clayburn Pert, MD Primary Care Physician:  None Primary Gastroenterologist:  Dr. Cephas Aguilar Reason for Consultation:     Acute diverticulitis        HPI:   Scott Aguilar is a 36 y.o. male referred by Dr. Clayburn Pert for consultation & management of acute diverticulitis and discuss about colonoscopy prior to sigmoid colectomy. He is morbidly obese, had initial attack of acute diverticulitis on 01/07/2017, admitted and responded to antibiotics. He returned a week later on 01/19/2017 with new onset of diarrhea and worsening abdominal pain, was found to have acute complicated diverticulitis with perforation and small abscess that was not amenable to CT-guided drainage as well as positive for C. difficile infection. He was treated with Cipro, Flagyl and oral vancomycin and was discharged home on 01/26/2017. He had 2 follow-up CT scans which revealed improvement in the fluid collections as well as extraluminal gas. He is followed by surgery as outpatient and was discussed about sigmoid colectomy. He is referred to me for colonoscopy prior to surgery.  Today, patient denies having abdominal pain, diarrhea, nausea, vomiting, rectal bleeding. Apparently, he had at least 4-5 mild attacks of diverticulitis in last 1 year but he managed with bland diet and the episodes were self-limiting until recently. He had repeat C. difficile test and it was negative  NSAIDs: None  Antiplts/Anticoagulants/Anti thrombotics: None He did not have any GI surgeries He denies any family history of colon cancer He denies smoking tobacco or alcohol He is a Nature conservation officer GI Procedures: None  Past Medical History:  Diagnosis Date  . Acute diverticulitis 01/19/2017  . Asthma    Childhood asthma  . Cancer ([place]) 2013   melanoma back left shoulder  . Diverticulitis 01/07/2017  . Enteritis due to Clostridium difficile     Past Surgical History:  Procedure Laterality Date  . MELANOMA EXCISION  2013   Dr. Evorn Gong    Prior to Admission medications   Medication Sig Start Date End Date Taking? Authorizing Provider  acidophilus (RISAQUAD) CAPS capsule Take 2 capsules by mouth daily. Patient not taking: Reported on 03/24/2017 01/27/17   Olean Ree, MD  ibuprofen (ADVIL,MOTRIN) 200 MG tablet Take 400 mg by mouth every 6 (six) hours as needed.    [provider]  vancomycin (VANCOCIN) 125 MG capsule TAKE ONE CAPSULE BY MOUTH 4 TIMES A DAY 01/26/17   [provider]    Family History  Problem Relation Age of Onset  . Heart disease Father      Social History  Substance Use Topics  . Smoking status: Never Smoker  . Smokeless tobacco: Current User    Types: Snuff  . Alcohol use No    Allergies as of 03/24/2017  . (No Known Allergies)    Review of Systems:    All systems reviewed and negative except where noted in HPI.   Physical Exam:  BP 139/81   Pulse 79   Temp 98.2 F (36.8 C) (Oral)   Ht 6\' 2"  (1.88 m)   Wt (!) 364 lb 3.2 oz (165.2 kg)   BMI 46.76 kg/m  No LMP for male patient.  General:   Alert,  Well-developed, well-nourished, pleasant and cooperative in NAD Head:  Normocephalic and atraumatic. Eyes:  Sclera clear, no icterus.   Conjunctiva pink.  Ears:  Normal auditory acuity. Nose:  No deformity, discharge, or lesions. Mouth:  No deformity or lesions,oropharynx pink & moist. Neck:  Supple; no masses or thyromegaly. Lungs:  Respirations even and unlabored.  Clear throughout to auscultation.   No wheezes, crackles, or rhonchi. No acute distress. Heart:  Regular rate and rhythm; no murmurs, clicks, rubs, or gallops. Abdomen:  Normal bowel sounds. Soft, obese, non-tender and non-distended without masses, hepatosplenomegaly or hernias noted.  No guarding or rebound  tenderness.   Rectal: Nor performed Msk:  Symmetrical without gross deformities. Good, equal movement & strength bilaterally. Pulses:  Normal pulses noted. Extremities:  No clubbing or edema.  No cyanosis. Neurologic:  Alert and oriented x3;  grossly normal neurologically. Skin:  Intact without significant lesions or rashes. No jaundice. Lymph Nodes:  No significant cervical adenopathy. Psych:  Alert and cooperative. Normal mood and affect.  Imaging Studies: Reviewed  Assessment and Plan:   [name] is a 36 y.o. male with morbid obesity, one year history of recurrent acute diverticulitis followed by complicated diverticulitis with diverticular perforation and abscess that has responded to antibiotics, also with history of C. difficile infection which is treated with oral vancomycin for 2 weeks. He is currently asymptomatic. I discussed with him about colonoscopy to be performed in next 4 weeks. I also cautioned him to watch for symptoms of diverticulitis during bowel prep and he should stop the bowel prep right away and contact GI on call   I also advised him to follow a healthy diet including high fiber, low carb diet to lose weight  I have discussed alternative options, risks & benefits,  which include, but are not limited to, bleeding, infection, perforation,respiratory complication & drug reaction.  The patient agrees with this plan & written consent will be obtained.     Follow up at the time of colonoscopy   Scott Darby, MD

## 2017-04-20 ENCOUNTER — Encounter: Payer: Self-pay | Admitting: Anesthesiology

## 2017-04-20 ENCOUNTER — Ambulatory Visit: Payer: 59 | Admitting: Anesthesiology

## 2017-04-20 ENCOUNTER — Encounter
Admission: RE | Disposition: A | Discharge status: Home / Self Care | Payer: Self-pay | Source: Ambulatory Visit | Attending: Gastroenterology

## 2017-04-20 ENCOUNTER — Ambulatory Visit
Admission: RE | Admit: 2017-04-20 | Discharge: 2017-04-20 | Disposition: A | Discharge status: Home / Self Care | Payer: 59 | Source: Ambulatory Visit | Attending: Gastroenterology | Admitting: Gastroenterology

## 2017-04-20 DIAGNOSIS — R21 Rash and other nonspecific skin eruption: Secondary | ICD-10-CM | POA: Diagnosis not present

## 2017-04-20 DIAGNOSIS — K5792 Diverticulitis of intestine, part unspecified, without perforation or abscess without bleeding: Secondary | ICD-10-CM | POA: Diagnosis not present

## 2017-04-20 DIAGNOSIS — K5730 Diverticulosis of large intestine without perforation or abscess without bleeding: Secondary | ICD-10-CM | POA: Diagnosis not present

## 2017-04-20 DIAGNOSIS — D128 Benign neoplasm of rectum: Secondary | ICD-10-CM | POA: Diagnosis not present

## 2017-04-20 DIAGNOSIS — K5790 Diverticulosis of intestine, part unspecified, without perforation or abscess without bleeding: Secondary | ICD-10-CM | POA: Diagnosis not present

## 2017-04-20 DIAGNOSIS — Z85820 Personal history of malignant melanoma of skin: Secondary | ICD-10-CM | POA: Insufficient documentation

## 2017-04-20 DIAGNOSIS — Z8249 Family history of ischemic heart disease and other diseases of the circulatory system: Secondary | ICD-10-CM | POA: Insufficient documentation

## 2017-04-20 DIAGNOSIS — K621 Rectal polyp: Secondary | ICD-10-CM | POA: Diagnosis not present

## 2017-04-20 DIAGNOSIS — J45909 Unspecified asthma, uncomplicated: Secondary | ICD-10-CM | POA: Diagnosis not present

## 2017-04-20 DIAGNOSIS — K635 Polyp of colon: Secondary | ICD-10-CM | POA: Diagnosis not present

## 2017-04-20 DIAGNOSIS — Z6841 Body Mass Index (BMI) 40.0 and over, adult: Secondary | ICD-10-CM | POA: Insufficient documentation

## 2017-04-20 DIAGNOSIS — K5732 Diverticulitis of large intestine without perforation or abscess without bleeding: Secondary | ICD-10-CM | POA: Diagnosis present

## 2017-04-20 HISTORY — PX: COLONOSCOPY WITH PROPOFOL: SHX5780

## 2017-04-20 SURGERY — COLONOSCOPY WITH PROPOFOL
Anesthesia: General

## 2017-04-20 MED ORDER — FENTANYL CITRATE (PF) 100 MCG/2ML IJ SOLN
INTRAMUSCULAR | Status: DC | PRN
  Administered 2017-04-20 (×2): 25 ug via INTRAVENOUS
  Administered 2017-04-20: 50 ug via INTRAVENOUS

## 2017-04-20 MED ORDER — MIDAZOLAM HCL 2 MG/2ML IJ SOLN
INTRAMUSCULAR | Status: DC | PRN
  Administered 2017-04-20: 2 mg via INTRAVENOUS

## 2017-04-20 MED ORDER — FENTANYL CITRATE (PF) 100 MCG/2ML IJ SOLN
INTRAMUSCULAR | Status: AC
  Filled 2017-04-20: qty 2

## 2017-04-20 MED ORDER — PROPOFOL 10 MG/ML IV BOLUS
INTRAVENOUS | Status: AC
  Filled 2017-04-20: qty 20

## 2017-04-20 MED ORDER — PROPOFOL 500 MG/50ML IV EMUL
INTRAVENOUS | Status: DC | PRN
  Administered 2017-04-20: 140 ug/kg/min via INTRAVENOUS

## 2017-04-20 MED ORDER — LIDOCAINE HCL (PF) 1 % IJ SOLN
2.0000 mL | Freq: Once | INTRAMUSCULAR | Status: AC
  Administered 2017-04-20: 0.3 mL via INTRADERMAL

## 2017-04-20 MED ORDER — LIDOCAINE HCL (PF) 1 % IJ SOLN
INTRAMUSCULAR | Status: AC
  Administered 2017-04-20: 0.3 mL via INTRADERMAL
  Filled 2017-04-20: qty 2

## 2017-04-20 MED ORDER — SODIUM CHLORIDE 0.9 % IV SOLN
INTRAVENOUS | Status: DC
  Administered 2017-04-20: 1000 mL via INTRAVENOUS

## 2017-04-20 MED ORDER — MIDAZOLAM HCL 2 MG/2ML IJ SOLN
INTRAMUSCULAR | Status: AC
  Filled 2017-04-20: qty 2

## 2017-04-20 MED ORDER — PROPOFOL 500 MG/50ML IV EMUL
INTRAVENOUS | Status: AC
  Filled 2017-04-20: qty 50

## 2017-04-20 NOTE — Anesthesia Post-op Follow-up Note (Signed)
Anesthesia QCDR form completed.        

## 2017-04-20 NOTE — Anesthesia Preprocedure Evaluation (Addendum)
Anesthesia Evaluation  Patient identified by MRN, date of birth, ID band Patient awake    Reviewed: Allergy & Precautions, NPO status , Patient's Chart, lab work & pertinent test results, reviewed documented beta blocker date and time   Airway Mallampati: III  TM Distance: >3 FB     Dental  (+) Chipped   Pulmonary asthma ,           Cardiovascular      Neuro/Psych    GI/Hepatic   Endo/Other  Morbid obesity  Renal/GU      Musculoskeletal   Abdominal   Peds  Hematology   Anesthesia Other Findings   Reproductive/Obstetrics                            Anesthesia Physical Anesthesia Plan  ASA: III  Anesthesia Plan: General   Post-op Pain Management:    Induction: Intravenous  PONV Risk Score and Plan:   Airway Management Planned:   Additional Equipment:   Intra-op Plan:   Post-operative Plan:   Informed Consent: I have reviewed the patients History and Physical, chart, labs and discussed the procedure including the risks, benefits and alternatives for the proposed anesthesia with the patient or authorized representative who has indicated his/her understanding and acceptance.     Plan Discussed with: CRNA  Anesthesia Plan Comments:         Anesthesia Quick Evaluation

## 2017-04-20 NOTE — H&P (Signed)
  Cephas Darby, MD [address]  [place]  [place], [place] [address]  Main: [phone]  Fax: [phone] Pager: [phone]  Primary Care Physician:  Patient, No Pcp Per Primary Gastroenterologist:  Dr. Cephas Darby  Pre-Procedure History & Physical: HPI:  Scott Aguilar is a 36 y.o. male is here for an colonoscopy.   Past Medical History:  Diagnosis Date  . Acute diverticulitis 01/19/2017  . Asthma    Childhood asthma  . Cancer ([place]) 2013   melanoma back left shoulder  . Diverticulitis 01/07/2017  . Enteritis due to Clostridium difficile     Past Surgical History:  Procedure Laterality Date  . MELANOMA EXCISION  2013   Dr. Evorn Gong    Prior to Admission medications   Medication Sig Start Date End Date Taking? Authorizing Provider  acidophilus (RISAQUAD) CAPS capsule Take 2 capsules by mouth daily. Patient not taking: Reported on 03/24/2017 01/27/17   Olean Ree, MD  ibuprofen (ADVIL,MOTRIN) 200 MG tablet Take 400 mg by mouth every 6 (six) hours as needed.    [provider]  vancomycin (VANCOCIN) 125 MG capsule TAKE ONE CAPSULE BY MOUTH 4 TIMES A DAY 01/26/17   [provider]    Allergies as of 03/24/2017  . (No Known Allergies)    Family History  Problem Relation Age of Onset  . Heart disease Father     Social History   Socioeconomic History  . Marital status: Married    Spouse name: Not on file  . Number of children: Not on file  . Years of education: Not on file  . Highest education level: Not on file  Social Needs  . Financial resource strain: Not on file  . Food insecurity - worry: Not on file  . Food insecurity - inability: Not on file  . Transportation needs - medical: Not on file  . Transportation needs - non-medical: Not on file  Occupational History  . Not on file  Tobacco Use  . Smoking status: Never Smoker  . Smokeless tobacco: Current User    Types: Snuff  Substance and Sexual Activity  . Alcohol use: No    . Drug use: No  . Sexual activity: Yes  Other Topics Concern  . Not on file  Social History Narrative  . Not on file    Review of Systems: See HPI, otherwise negative ROS  Physical Exam: BP (!) 132/97   Pulse 78   Temp 98.5 F (36.9 C)   Resp 17   Ht 6\' 3"  (1.905 m)   Wt (!) 363 lb (164.7 kg)   SpO2 98%   BMI 45.37 kg/m  General:   Alert,  pleasant and cooperative in NAD Head:  Normocephalic and atraumatic. Neck:  Supple; no masses or thyromegaly. Lungs:  Clear throughout to auscultation.    Heart:  Regular rate and rhythm. Abdomen:  Soft, nontender and nondistended. Normal bowel sounds, without guarding, and without rebound.   Neurologic:  Alert and  oriented x4;  grossly normal neurologically.  Impression/Plan: Scott Aguilar is here for an colonoscopy to be performed for acute diverticulitis  Risks, benefits, limitations, and alternatives regarding  colonoscopy have been reviewed with the patient.  Questions have been answered.  All parties agreeable.   Sherri Sear, MD  04/20/2017, 9:27 AM

## 2017-04-20 NOTE — Transfer of Care (Signed)
Immediate Anesthesia Transfer of Care Note  Patient: Scott Aguilar  Procedure(s) Performed: COLONOSCOPY WITH PROPOFOL (N/A )  Patient Location: PACU  Anesthesia Type:General  Level of Consciousness: awake and sedated  Airway & Oxygen Therapy: Patient Spontanous Breathing and Patient connected to nasal cannula oxygen  Post-op Assessment: Report given to RN and Post -op Vital signs reviewed and stable  Post vital signs: Reviewed and stable  Last Vitals:  Vitals:   04/20/17 0859  BP: (!) 132/97  Pulse: 78  Resp: 17  Temp: 36.9 C  SpO2: 98%    Last Pain: There were no vitals filed for this visit.       Complications: No apparent anesthesia complications

## 2017-04-20 NOTE — Op Note (Signed)
[facility] Gastroenterology Patient Name: Scott Aguilar Procedure Date: 04/20/2017 9:49 AM MRN: [id number] Account #: [id number] Date of Birth: 11-04-1980 Admit Type: Outpatient Age: 36 Room: [facility] ENDO ROOM 1 Gender: Male Note Status: Finalized Procedure:            Colonoscopy Indications:          acute diverticulitis Providers:            Lin Landsman MD, MD Referring MD:         No Local Md, MD (Referring MD) Medicines:            Monitored Anesthesia Care Complications:        No immediate complications. Estimated blood loss: None. Procedure:            Pre-Anesthesia Assessment:                       - Prior to the procedure, a History and Physical was                        performed, and patient medications and allergies were                        reviewed. The patient is competent. The risks and                        benefits of the procedure and the sedation options and                        risks were discussed with the patient. All questions                        were answered and informed consent was obtained.                        Patient identification and proposed procedure were                        verified by the physician, the nurse, the                        anesthesiologist, the anesthetist and the technician in                        the pre-procedure area in the procedure room. Mental                        Status Examination: alert and oriented. Airway                        Examination: normal oropharyngeal airway and neck                        mobility. Respiratory Examination: clear to                        auscultation. CV Examination: normal. Prophylactic                        Antibiotics: The patient does not require prophylactic  antibiotics. Prior Anticoagulants: The patient has                        taken no previous anticoagulant or antiplatelet agents.                        ASA Grade  Assessment: III - A patient with severe                        systemic disease. After reviewing the risks and                        benefits, the patient was deemed in satisfactory                        condition to undergo the procedure. The anesthesia plan                        was to use monitored anesthesia care (MAC). Immediately                        prior to administration of medications, the patient was                        re-assessed for adequacy to receive sedatives. The                        heart rate, respiratory rate, oxygen saturations, blood                        pressure, adequacy of pulmonary ventilation, and                        response to care were monitored throughout the                        procedure. The physical status of the patient was                        re-assessed after the procedure.                       After obtaining informed consent, the colonoscope was                        passed under direct vision. Throughout the procedure,                        the patient's blood pressure, pulse, and oxygen                        saturations were monitored continuously. The                        Colonoscope was introduced through the anus and                        advanced to the the cecum, identified by appendiceal  orifice and ileocecal valve. The colonoscopy was                        technically difficult and complex due to tight                        rectosigmoid junction due to severe diverticulosis.                        Successful completion of the procedure was aided by                        applying abdominal pressure. The patient tolerated the                        procedure well. The quality of the bowel preparation                        was evaluated using the BBPS [facility] Bowel Preparation                        Scale) with scores of: Right Colon = 3, Transverse                        Colon = 3 and Left  Colon = 3 (entire mucosa seen well                        with no residual staining, small fragments of stool or                        opaque liquid). The total BBPS score equals 9. Findings:      The perianal exam findings include a perianal rash.      The perianal and digital rectal examinations were normal. Pertinent       negatives include normal sphincter tone and no palpable rectal lesions.      Moderate small and large-mouthed diverticula were found in the       recto-sigmoid colon resulting in narrow rectosigmoid segment .      A 5 mm polyp was found in the rectum. The polyp was sessile. The polyp       was removed with a cold snare. Resection and retrieval were complete.      The retroflexed view of the distal rectum and anal verge was normal and       showed no anal or rectal abnormalities. Impression:           - Perianal rash found on perianal exam.                       - Moderate diverticulosis in the recto-sigmoid colon.                       - One 5 mm polyp in the rectum, removed with a cold                        snare. Resected and retrieved.                       - The distal rectum and anal verge are normal on  retroflexion view. Recommendation:       - Discharge patient to home.                       - Resume previous diet today.                       - Continue present medications.                       - Await pathology results.                       - Repeat colonoscopy in 5-10 years for surveillance                        based on pathology results.                       - Follow up with Dr Dahlia Byes to discuss about sigmoid                        resection Procedure Code(s):    --- Professional ---                       [phone], Colonoscopy, flexible; with removal of tumor(s),                        polyp(s), or other lesion(s) by snare technique Diagnosis Code(s):    --- Professional ---                       R21, Rash and other nonspecific  skin eruption                       K62.1, Rectal polyp                       K57.30, Diverticulosis of large intestine without                        perforation or abscess without bleeding CPT copyright 2016 American Medical Association. All rights reserved. The codes documented in this report are preliminary and upon coder review may  be revised to meet current compliance requirements. Dr. Ulyess Mort Lin Landsman MD, MD 04/20/2017 10:22:08 AM This report has been signed electronically. Number of Addenda: 0 Note Initiated On: 04/20/2017 9:49 AM Scope Withdrawal Time: 0 hours 7 minutes 27 seconds  Total Procedure Duration: 0 hours 17 minutes 17 seconds       [facility]

## 2017-04-20 NOTE — Anesthesia Procedure Notes (Signed)
Performed by: Cook-Martin, Anetta Olvera °Pre-anesthesia Checklist: Patient identified, Emergency Drugs available, Suction available, Patient being monitored and Timeout performed °Patient Re-evaluated:Patient Re-evaluated prior to induction °Oxygen Delivery Method: Simple face mask °Preoxygenation: Pre-oxygenation with 100% oxygen °Induction Type: IV induction °Placement Confirmation: positive ETCO2 and CO2 detector ° ° ° ° ° ° °

## 2017-04-20 NOTE — Anesthesia Postprocedure Evaluation (Signed)
Anesthesia Post Note  Patient: Scott Aguilar  Procedure(s) Performed: COLONOSCOPY WITH PROPOFOL (N/A )  Patient location during evaluation: Endoscopy Anesthesia Type: General Level of consciousness: awake and alert Pain management: pain level controlled Vital Signs Assessment: post-procedure vital signs reviewed and stable Respiratory status: spontaneous breathing, nonlabored ventilation, respiratory function stable and patient connected to nasal cannula oxygen Cardiovascular status: blood pressure returned to baseline and stable Postop Assessment: no apparent nausea or vomiting Anesthetic complications: no     Last Vitals:  Vitals:   04/20/17 1048 04/20/17 1058  BP:  (!) 140/91  Pulse: 75 72  Resp: 19 18  Temp:    SpO2: 97% 98%    Last Pain:  Vitals:   04/20/17 1018  TempSrc: Tympanic                 Berle Fitz S

## 2017-04-21 LAB — SURGICAL PATHOLOGY

## 2017-04-24 ENCOUNTER — Encounter: Payer: Self-pay | Admitting: Gastroenterology

## 2017-04-27 ENCOUNTER — Telehealth: Payer: Self-pay

## 2017-04-27 NOTE — Telephone Encounter (Signed)
Called patient and had to leave him a detailed message to call us back so we could schedule him a follow appointment with Dr. Adonis Huguenin next week to discuss possible surgery.   Please schedule follow up appointment.

## 2017-04-28 NOTE — Telephone Encounter (Signed)
Patient called and scheduled a follow up appointment on 05/05/2017.

## 2017-05-02 ENCOUNTER — Other Ambulatory Visit: Payer: Self-pay

## 2017-05-05 ENCOUNTER — Ambulatory Visit: Payer: 59 | Admitting: General Surgery

## 2017-05-05 ENCOUNTER — Encounter: Payer: Self-pay | Admitting: General Surgery

## 2017-05-05 DIAGNOSIS — K5792 Diverticulitis of intestine, part unspecified, without perforation or abscess without bleeding: Secondary | ICD-10-CM

## 2017-05-05 MED ORDER — POLYETHYLENE GLYCOL 3350 17 GM/SCOOP PO POWD: ORAL | 0 refills | Status: DC

## 2017-05-05 MED ORDER — BISACODYL 5 MG PO TBEC: DELAYED_RELEASE_TABLET | ORAL | 0 refills | Status: DC

## 2017-05-05 MED ORDER — ERYTHROMYCIN BASE 500 MG PO TABS: ORAL_TABLET | ORAL | 0 refills | Status: DC

## 2017-05-05 MED ORDER — NEOMYCIN SULFATE 500 MG PO TABS: ORAL_TABLET | ORAL | 0 refills | Status: DC

## 2017-05-05 MED ORDER — FLEET ENEMA 7-19 GM/118ML RE ENEM: ENEMA | RECTAL | 0 refills | Status: DC

## 2017-05-05 NOTE — Progress Notes (Signed)
Outpatient Surgical Follow Up  05/05/2017  Scott Aguilar is an 36 y.o. male.   Chief complaint: Follow-up recent diverticulitis  HPI: 36 year old male returns to clinic for follow-up from his recent bouts of diverticulitis.  He reports she has not had any additional symptoms since his last visit.  He had a colonoscopy that only showed diverticular disease as well as the beginnings of a stricture but without complete obstruction.  He reports that he is finally mentally ready to proceed with surgery and is here to discuss about it today.  He denies any recent fevers, chills, nausea, vomiting, chest pain, shortness of breath, diarrhea, constipation.  Past Medical History:  Diagnosis Date  . Acute diverticulitis 01/19/2017  . Asthma    Childhood asthma  . Cancer ([place]) 2013   melanoma back left shoulder  . Diverticulitis 01/07/2017  . Enteritis due to Clostridium difficile     Past Surgical History:  Procedure Laterality Date  . COLONOSCOPY WITH PROPOFOL N/A 04/20/2017   Procedure: COLONOSCOPY WITH PROPOFOL;  Surgeon: Lin Landsman, MD;  Location: [facility] ENDOSCOPY;  Service: Gastroenterology;  Laterality: N/A;  . MELANOMA EXCISION  2013   Dr. Evorn Gong    Family History  Problem Relation Age of Onset  . Heart disease Father     Social History:  reports that  has never smoked. His smokeless tobacco use includes snuff. He reports that he does not drink alcohol or use drugs.  Allergies: No Known Allergies  Medications reviewed.    ROS A multipoint review of systems was completed, all pertinent positives and negatives were document within the HPI and the remainder are negative   There were no vitals taken for this visit.  Physical Exam  General: No acute distress Neck: Full and nontender Chest: Clear to auscultation  heart: Rate and rhythm Abdomen: Large, soft, nontender, nondistended Extremities: Moves all extremities well   No results found for this or any previous  visit (from the past 48 hour(s)). No results found.  Assessment/Plan:  1. Diverticulitis 36 year old male with recurrent diverticulitis in the past returns to discuss surgery.  Discussed the procedure of a laparoscopic sigmoid colectomy in detail to include the risk, benefits, alternatives.  The primary risk discussed were conversion to open, damage to surrounding structures requiring additional surgeries, anastomotic failure requiring ostomy, need for possible prolonged hospital stay.  Patient voiced understanding and desires to proceed.  We will tentatively plan to proceed with surgery on 16 January.  A total of 25 minutes was used on this encounter with greater than 50% of it used for counseling or coordination of care     Clayburn Pert, MD [facility] General Surgeon  05/05/2017,12:26 PM

## 2017-05-05 NOTE — Patient Instructions (Signed)
We have discussed removing a portion of your damaged colon through 4 small incisions today. We have scheduled this surgery for 06/07/2017 at [facility] with Dr. Adonis Huguenin. Please plan a hospital stay of 3-7 days for surgery and recovery time.  We will have you complete a bowel prep prior to your surgery. Please see information provided. You have also been given a (Blue) Pre-Care Sheet with more information regarding your particular surgery. Please review all information given.  Please call our office with any questions or concerns prior to your scheduled surgery.   Laparoscopic Colectomy Laparoscopic colectomy is surgery to remove part or all of the large intestine (colon). This procedure may be used to treat several conditions, including:  Inflammation and infection of the colon (diverticulitis).  Tumors or masses in the colon.  Inflammatory bowel disease, such as Crohn disease or ulcerative colitis. Colectomy is an option when symptoms cannot be controlled with medicines.  Bleeding from the colon that cannot be controlled by another method.  Blockage or obstruction of the colon.  Tell a health care provider about:  Any allergies you have.  All medicines you are taking, including vitamins, herbs, eye drops, creams, and over-the-counter medicines.  Any problems you or family members have had with anesthetic medicines.  Any blood disorders you have.  Any surgeries you have had.  Any medical conditions you have. What are the risks? Generally, this is a safe procedure. However, problems may occur, including:  Infection.  Bleeding.  Allergic reactions to medicines or dyes.  Damage to other structures or organs.  Leaking from where the colon was sewn together.  Future blockage of the small intestines from scar tissue. Another surgery may be needed to repair this.  Needing to convert to an open procedure. Complications such as damage to other organs or excessive bleeding may require  the surgeon to convert from a laparoscopic procedure to an open procedure. This involves making a larger incision in the abdomen.  What happens before the procedure? Staying hydrated Follow instructions from your health care provider about hydration, which may include:  Up to 2 hours before the procedure - you may continue to drink clear liquids, such as water, clear fruit juice, black coffee, and plain tea.  Eating and drinking restrictions Follow instructions from your health care provider about eating and drinking, which may include:  8 hours before the procedure - stop eating heavy meals, meals with high fiber, or foods such as meat, fried foods, or fatty foods.  6 hours before the procedure - stop eating light meals or foods, such as toast or cereal.  6 hours before the procedure - stop drinking milk or drinks that contain milk.  2 hours before the procedure - stop drinking clear liquids.  Medicines  Ask your health care provider about: ? Changing or stopping your regular medicines. This is especially important if you are taking diabetes medicines or blood thinners. ? Taking medicines such as aspirin and ibuprofen. These medicines can thin your blood. Do not take these medicines before your procedure if your health care provider instructs you not to.  You may be given antibiotic medicine to clean out bacteria from your colon. Follow the directions carefully and take the medicine at the correct time. General instructions  You may be prescribed an oral bowel prep to clean out your colon in preparation for the surgery: ? Follow instructions from your health care provider about how to do this. ? Do not eat or drink anything else after  you have started the bowel prep, unless your health care provider tells you it is safe to do so.  Do not use any products that contain nicotine or tobacco, such as cigarettes and e-cigarettes. If you need help quitting, ask your health care  provider. What happens during the procedure?  To reduce your risk of infection: ? Your health care team will wash or sanitize their hands. ? Your skin will be washed with soap.  An IV tube will be inserted into one of your veins to deliver fluid and medication.  You will be given one of the following: ? A medicine to help you relax (sedative). ? A medicine to make you fall asleep (general anesthetic).  Small monitors will be connected to your body. They will be used to check your heart, blood pressure, and oxygen level.  A breathing tube may be placed into your lungs during the procedure.  A thin, flexible tube (catheter) will be placed into your bladder to drain urine.  A tube may be placed through your nose and into your stomach to drain stomach fluids (nasogastric tube, or NG tube).  Your abdomen will be filled with air so it expands. This gives the surgeon more room to operate and makes your organs easier to see.  Several small cuts (incisions) will be made in your abdomen.  A thin, lighted tube with a tiny camera on the end (laparoscope) will be put through one of the small incisions. The camera on the laparoscope will send a picture to a computer screen in the operating room. This will give the surgeon a good view inside your abdomen.  Hollow tubes will be put through the other small incisions in your abdomen. The tools that are needed for the procedure will be put through these tubes.  Clamps or staples will be put on both ends of the diseased part of the colon.  The part of the intestine between the clamps or staples will be removed.  If possible, the ends of the healthy colon that remain will be stitched (sutured) or stapled together to allow your body to pass waste (stool).  Sometimes, the remaining colon cannot be stitched back together. If this is the case, a colostomy will be needed. If you need a colostomy: ? An opening to the outside of your body (stoma) will be  made through your abdomen. ? The end of your colon will be brought to the opening. It will be stitched to the skin. ? A bag will be attached to the opening. Stool will drain into this removable bag. ? The colostomy may be temporary or permanent.  The incisions from the colectomy will be closed with sutures or staples. The procedure may vary among health care providers and hospitals. What happens after the procedure?  Your blood pressure, heart rate, breathing rate, and blood oxygen level will be monitored until the medicines you were given have worn off.  You will receive fluids through an IV tube until your bowels start to work properly.  Once your bowels are working again, you will be given clear liquids first and then solid food as tolerated.  You will be given medicines to control your pain and nausea, if needed.  Do not drive for 24 hours if you were given a sedative. This information is not intended to replace advice given to you by your health care provider. Make sure you discuss any questions you have with your health care provider. Document Released: 07/30/2002 Document Revised: 02/08/2016  Document Reviewed: 02/08/2016 Elsevier Interactive Patient Education  [employer].     Laparoscopic Colectomy, Care After This sheet gives you information about how to care for yourself after your procedure. Your health care provider may also give you more specific instructions. If you have problems or questions, contact your health care provider. What can I expect after the procedure? After your procedure, it is common to have the following:  Pain in your abdomen, especially in the incision areas. You will be given medicine to control the pain.  Tiredness. This is a normal part of the recovery process. Your energy level will return to normal over the next several weeks.  Changes in your bowel movements, such as constipation or needing to go more often. Talk with your health care  provider about how to manage this.  Follow these instructions at home: Medicines  Take over-the-counter and prescription medicines only as told by your health care provider.  Do not drive or use heavy machinery while taking prescription pain medicine.  Do not drink alcohol while taking prescription pain medicine.  If you were prescribed an antibiotic medicine, use it as told by your health care provider. Do not stop using the antibiotic even if you start to feel better. Incision care  Follow instructions from your health care provider about how to take care of your incision areas. Make sure you: ? Keep your incisions clean and dry. ? Wash your hands with soap and water before and after applying medicine to the areas, and before and after changing your bandage (dressing). If soap and water are not available, use hand sanitizer. ? Change your dressing as told by your health care provider. ? Leave stitches (sutures), skin glue, or adhesive strips in place. These skin closures may need to stay in place for 2 weeks or longer. If adhesive strip edges start to loosen and curl up, you may trim the loose edges. Do not remove adhesive strips completely unless your health care provider tells you to do that.  Do not wear tight clothing over the incisions. Tight clothing may rub and irritate the incision areas, which may cause the incisions to open.  Do not take baths, swim, or use a hot tub until your health care provider approves. Ask your health care provider if you can take showers. You may only be allowed to take sponge baths for bathing.  Check your incision area every day for signs of infection. Check for: ? More redness, swelling, or pain. ? More fluid or blood. ? Warmth. ? Pus or a bad smell. Activity  Avoid lifting anything that is heavier than 10 lb (4.5 kg) for 2 weeks or until your health care provider says it is okay.  You may resume normal activities as told by your health care  provider. Ask your health care provider what activities are safe for you.  Take rest breaks during the day as needed. Eating and drinking  Follow instructions from your health care provider about what you can eat after surgery.  To prevent or treat constipation while you are taking prescription pain medicine, your health care provider may recommend that you: ? Drink enough fluid to keep your urine clear or pale yellow. ? Take over-the-counter or prescription medicines. ? Eat foods that are high in fiber, such as fresh fruits and vegetables, whole grains, and beans. ? Limit foods that are high in fat and processed sugars, such as fried and sweet foods. General instructions  Ask your health care  provider when you will need an appointment to get your sutures or staples removed.  Keep all follow-up visits as told by your health care provider. This is important. Contact a health care provider if:  You have more redness, swelling, or pain around your incisions.  You have more fluid or blood coming from the incisions.  Your incisions feel warm to the touch.  You have pus or a bad smell coming from your incisions or your dressing.  You have a fever.  You have an incision that breaks open (edges not staying together) after sutures or staples have been removed. Get help right away if:  You develop a rash.  You have chest pain or difficulty breathing.  You have pain or swelling in your legs.  You feel light-headed or you faint.  Your abdomen swells (becomes distended).  You have nausea or vomiting.  You have blood in your stool (feces). This information is not intended to replace advice given to you by your health care provider. Make sure you discuss any questions you have with your health care provider. Document Released: 11/26/2004 Document Revised: 02/08/2016 Document Reviewed: 02/08/2016 Elsevier Interactive Patient Education  [employer].

## 2017-05-08 ENCOUNTER — Telehealth: Payer: Self-pay | Admitting: Radiology

## 2017-05-08 ENCOUNTER — Other Ambulatory Visit: Payer: Self-pay | Admitting: Radiology

## 2017-05-08 NOTE — Telephone Encounter (Signed)
error 

## 2017-05-09 ENCOUNTER — Telehealth: Payer: Self-pay | Admitting: General Surgery

## 2017-05-09 NOTE — Telephone Encounter (Signed)
Pt advised of pre op date/time and sx date. Sx: 06/07/17 with Dr Adonis Huguenin, Dr Maeola Sarah sigmoid colectomy.  Pre op: 05/30/17 @ 8:15am--office interview.   Patient made aware to call [phone], between 1-3:00pm the day before surgery, to find out what time to arrive.

## 2017-05-29 ENCOUNTER — Telehealth: Payer: Self-pay | Admitting: General Surgery

## 2017-05-29 NOTE — Telephone Encounter (Signed)
Patient has called and has decided to cancel surgery. Patient states that he did not realize that his deductible started back over at the beginning of the year. He states at this time he is already paying a large medical bill. He would like to wait to reschedule. I have advised him to call our office to schedule an appointment when he is ready to reschedule.   I have left a message on the voicemail with [facility] OR to call me in the morning to cancel the surgery. Pre admit has been advised.   SX was scheduled on 06/07/17 with Dr Adonis Huguenin, Dr Burt Knack and Dr Erlene Quan (stent placement) for a laparoscopic sigmoid colectomy. This has been canceled.

## 2017-05-30 ENCOUNTER — Inpatient Hospital Stay: Admission: RE | Admit: 2017-05-30 | Payer: 59 | Source: Ambulatory Visit

## 2017-06-07 ENCOUNTER — Encounter: Admission: RE | Payer: Self-pay | Source: Ambulatory Visit

## 2017-06-07 ENCOUNTER — Inpatient Hospital Stay: Admission: RE | Admit: 2017-06-07 | Payer: 59 | Source: Ambulatory Visit | Admitting: General Surgery

## 2017-06-07 SURGERY — COLECTOMY, SIGMOID, LAPAROSCOPIC
Anesthesia: Choice

## 2017-08-09 ENCOUNTER — Ambulatory Visit: Payer: 59 | Admitting: Physician Assistant

## 2017-08-09 ENCOUNTER — Encounter: Payer: Self-pay | Admitting: Physician Assistant

## 2017-08-09 VITALS — BP 136/84 | HR 94 | Temp 98.0°F | Ht 74.0 in | Wt 387.0 lb

## 2017-08-09 DIAGNOSIS — F320 Major depressive disorder, single episode, mild: Secondary | ICD-10-CM

## 2017-08-09 DIAGNOSIS — Z7689 Persons encountering health services in other specified circumstances: Secondary | ICD-10-CM | POA: Diagnosis not present

## 2017-08-09 DIAGNOSIS — Z23 Encounter for immunization: Secondary | ICD-10-CM

## 2017-08-09 DIAGNOSIS — F418 Other specified anxiety disorders: Secondary | ICD-10-CM | POA: Insufficient documentation

## 2017-08-09 MED ORDER — CITALOPRAM HYDROBROMIDE 20 MG PO TABS: 20.0000 mg | ORAL_TABLET | Freq: Every day | ORAL | 1 refills | Status: DC

## 2017-08-09 NOTE — Patient Instructions (Addendum)
Major Depressive Disorder, Adult Major depressive disorder (MDD) is a mental health condition. It may also be called clinical depression or unipolar depression. MDD usually causes feelings of sadness, hopelessness, or helplessness. MDD can also cause physical symptoms. It can interfere with work, school, relationships, and other everyday activities. MDD may be mild, moderate, or severe. It may occur once (single episode major depressive disorder) or it may occur multiple times (recurrent major depressive disorder). What are the causes? The exact cause of this condition is not known. MDD is most likely caused by a combination of things, which may include:  Genetic factors. These are traits that are passed along from parent to child.  Individual factors. Your personality, your behavior, and the way you handle your thoughts and feelings may contribute to MDD. This includes personality traits and behaviors learned from others.  Physical factors, such as: ? Differences in the part of your brain that controls emotion. This part of your brain may be different than it is in people who do not have MDD. ? Long-term (chronic) medical or psychiatric illnesses.  Social factors. Traumatic experiences or major life changes may play a role in the development of MDD.  What increases the risk? This condition is more likely to develop in women. The following factors may also make you more likely to develop MDD:  A family history of depression.  Troubled family relationships.  Abnormally low levels of certain brain chemicals.  Traumatic events in childhood, especially abuse or the loss of a parent.  Being under a lot of stress, or long-term stress, especially from upsetting life experiences or losses.  A history of: ? Chronic physical illness. ? Other mental health disorders. ? Substance abuse.  Poor living conditions.  Experiencing social exclusion or discrimination on a regular basis.  What are  the signs or symptoms? The main symptoms of MDD typically include:  Constant depressed or irritable mood.  Loss of interest in things and activities.  MDD symptoms may also include:  Sleeping or eating too much or too little.  Unexplained weight change.  Fatigue or low energy.  Feelings of worthlessness or guilt.  Difficulty thinking clearly or making decisions.  Thoughts of suicide or of harming others.  Physical agitation or weakness.  Isolation.  Severe cases of MDD may also occur with other symptoms, such as:  Delusions or hallucinations, in which you imagine things that are not real (psychotic depression).  Low-level depression that lasts at least a year (chronic depression or persistent depressive disorder).  Extreme sadness and hopelessness (melancholic depression).  Trouble speaking and moving (catatonic depression).  How is this diagnosed? This condition may be diagnosed based on:  Your symptoms.  Your medical history, including your mental health history. This may involve tests to evaluate your mental health. You may be asked questions about your lifestyle, including any drug and alcohol use, and how long you have had symptoms of MDD.  A physical exam.  Blood tests to rule out other conditions.  You must have a depressed mood and at least four other MDD symptoms most of the day, nearly every day in the same 2-week timeframe before your health care provider can confirm a diagnosis of MDD. How is this treated? This condition is usually treated by mental health professionals, such as psychologists, psychiatrists, and clinical social workers. You may need more than one type of treatment. Treatment may include:  Psychotherapy. This is also called talk therapy or counseling. Types of psychotherapy include: ? Cognitive behavioral   therapy (CBT). This type of therapy teaches you to recognize unhealthy feelings, thoughts, and behaviors, and replace them with  positive thoughts and actions. ? Interpersonal therapy (IPT). This helps you to improve the way you relate to and communicate with others. ? Family therapy. This treatment includes members of your family.  Medicine to treat anxiety and depression, or to help you control certain emotions and behaviors.  Lifestyle changes, such as: ? Limiting alcohol and drug use. ? Exercising regularly. ? Getting plenty of sleep. ? Making healthy eating choices. ? Spending more time outdoors.  Treatments involving stimulation of the brain can be used in situations with extremely severe symptoms, or when medicine or other therapies do not work over time. These treatments include electroconvulsive therapy, transcranial magnetic stimulation, and vagal nerve stimulation. Follow these instructions at home: Activity  Return to your normal activities as told by your health care provider.  Exercise regularly and spend time outdoors as told by your health care provider. General instructions  Take over-the-counter and prescription medicines only as told by your health care provider.  Do not drink alcohol. If you drink alcohol, limit your alcohol intake to no more than 1 drink a day for nonpregnant women and 2 drinks a day for men. One drink equals 12 oz of beer, 5 oz of wine, or 1 oz of hard liquor. Alcohol can affect any antidepressant medicines you are taking. Talk to your health care provider about your alcohol use.  Eat a healthy diet and get plenty of sleep.  Find activities that you enjoy doing, and make time to do them.  Consider joining a support group. Your health care provider may be able to recommend a support group.  Keep all follow-up visits as told by your health care provider. This is important. Where to find more information: [employer] on Mental Illness  www.nami.org  U.S. National Institute of Mental Health  https://carter.com/  National Suicide Prevention  Lifeline  1-800-273-TALK 630-115-5234). This is free, 24-hour help.  Contact a health care provider if:  Your symptoms get worse.  You develop new symptoms. Get help right away if:  You self-harm.  You have serious thoughts about hurting yourself or others.  You see, hear, taste, smell, or feel things that are not present (hallucinate). This information is not intended to replace advice given to you by your health care provider. Make sure you discuss any questions you have with your health care provider. Document Released: 09/03/2012 Document Revised: 01/14/2016 Document Reviewed: 11/18/2015 Elsevier Interactive Patient Education  2018 [employer]. Citalopram tablets What is this medicine? CITALOPRAM (sye TAL oh pram) is a medicine for depression. This medicine may be used for other purposes; ask your health care provider or pharmacist if you have questions. COMMON BRAND NAME(S): Celexa What should I tell my health care provider before I take this medicine? They need to know if you have any of these conditions: -bleeding disorders -bipolar disorder or a family history of bipolar disorder -glaucoma -heart disease -history of irregular heartbeat -kidney disease -liver disease -low levels of magnesium or potassium in the blood -receiving electroconvulsive therapy -seizures -suicidal thoughts, plans, or attempt; a previous suicide attempt by you or a family member -take medicines that treat or prevent blood clots -thyroid disease -an unusual or allergic reaction to citalopram, escitalopram, other medicines, foods, dyes, or preservatives -pregnant or trying to become pregnant -breast-feeding How should I use this medicine? Take this medicine by mouth with a glass of water. Follow the  directions on the prescription label. You can take it with or without food. Take your medicine at regular intervals. Do not take your medicine more often than directed. Do not stop taking this medicine  suddenly except upon the advice of your doctor. Stopping this medicine too quickly may cause serious side effects or your condition may worsen. A special MedGuide will be given to you by the pharmacist with each prescription and refill. Be sure to read this information carefully each time. Talk to your pediatrician regarding the use of this medicine in children. Special care may be needed. Patients over 6 years old may have a stronger reaction and need a smaller dose. Overdosage: If you think you have taken too much of this medicine contact a poison control center or emergency room at once. NOTE: This medicine is only for you. Do not share this medicine with others. What if I miss a dose? If you miss a dose, take it as soon as you can. If it is almost time for your next dose, take only that dose. Do not take double or extra doses. What may interact with this medicine? Do not take this medicine with any of the following medications: -certain medicines for fungal infections like fluconazole, itraconazole, ketoconazole, posaconazole, voriconazole -cisapride -dofetilide -dronedarone -escitalopram -linezolid -MAOIs like Carbex, Eldepryl, Marplan, Nardil, and Parnate -methylene blue (injected into a vein) -pimozide -thioridazine -ziprasidone This medicine may also interact with the following medications: -alcohol -amphetamines -aspirin and aspirin-like medicines -carbamazepine -certain medicines for depression, anxiety, or psychotic disturbances -certain medicines for infections like chloroquine, clarithromycin, erythromycin, furazolidone, isoniazid, pentamidine -certain medicines for migraine headaches like almotriptan, eletriptan, frovatriptan, naratriptan, rizatriptan, sumatriptan, zolmitriptan -certain medicines for sleep -certain medicines that treat or prevent blood clots like dalteparin, enoxaparin,  warfarin -cimetidine -diuretics -fentanyl -lithium -methadone -metoprolol -NSAIDs, medicines for pain and inflammation, like ibuprofen or naproxen -omeprazole -other medicines that prolong the QT interval (cause an abnormal heart rhythm) -procarbazine -rasagiline -supplements like St. John's wort, kava kava, valerian -tramadol -tryptophan This list may not describe all possible interactions. Give your health care provider a list of all the medicines, herbs, non-prescription drugs, or dietary supplements you use. Also tell them if you smoke, drink alcohol, or use illegal drugs. Some items may interact with your medicine. What should I watch for while using this medicine? Tell your doctor if your symptoms do not get better or if they get worse. Visit your doctor or health care professional for regular checks on your progress. Because it may take several weeks to see the full effects of this medicine, it is important to continue your treatment as prescribed by your doctor. Patients and their families should watch out for new or worsening thoughts of suicide or depression. Also watch out for sudden changes in feelings such as feeling anxious, agitated, panicky, irritable, hostile, aggressive, impulsive, severely restless, overly excited and hyperactive, or not being able to sleep. If this happens, especially at the beginning of treatment or after a change in dose, call your health care professional. Dennis Bast may get drowsy or dizzy. Do not drive, use machinery, or do anything that needs mental alertness until you know how this medicine affects you. Do not stand or sit up quickly, especially if you are an older patient. This reduces the risk of dizzy or fainting spells. Alcohol may interfere with the effect of this medicine. Avoid alcoholic drinks. Your mouth may get dry. Chewing sugarless gum or sucking hard candy, and drinking plenty of water will help.  Contact your doctor if the problem does not go away  or is severe. What side effects may I notice from receiving this medicine? Side effects that you should report to your doctor or health care professional as soon as possible: -allergic reactions like skin rash, itching or hives, swelling of the face, lips, or tongue -anxious -black, tarry stools -breathing problems -changes in vision -chest pain -confusion -elevated mood, decreased need for sleep, racing thoughts, impulsive behavior -eye pain -fast, irregular heartbeat -feeling faint or lightheaded, falls -feeling agitated, angry, or irritable -hallucination, loss of contact with reality -loss of balance or coordination -loss of memory -painful or prolonged erections -restlessness, pacing, inability to keep still -seizures -stiff muscles -suicidal thoughts or other mood changes -trouble sleeping -unusual bleeding or bruising -unusually weak or tired -vomiting Side effects that usually do not require medical attention (report to your doctor or health care professional if they continue or are bothersome): -change in appetite or weight -change in sex drive or performance -dizziness -headache -increased sweating -indigestion, nausea -tremors This list may not describe all possible side effects. Call your doctor for medical advice about side effects. You may report side effects to FDA at 1-800-FDA-1088. Where should I keep my medicine? Keep out of reach of children. Store at room temperature between 15 and 30 degrees C (59 and 86 degrees F). Throw away any unused medicine after the expiration date. NOTE: This sheet is a summary. It may not cover all possible information. If you have questions about this medicine, talk to your doctor, pharmacist, or health care provider.  2018 Elsevier/Gold Standard (2015-10-12 13:18:52)

## 2017-08-09 NOTE — Progress Notes (Signed)
       Patient: Scott Aguilar Male    DOB: January 09, 1981   37 y.o.   MRN: [id number] Visit Date: 08/09/2017  Today's Provider: Mar Daring, PA-C   Chief Complaint  Patient presents with  . Establish Care   Subjective:    HPI Scott Aguilar is a 37 year old male who presents today to [facility] as a new patient. Patient states he did not have a prior PCP and would be seen at Urgent Care if needed for acute illness. Patient reports feeling fairly well overall. He reports becoming more agitated over work and home situations. He is requesting to start a medication for mood stability. He reports exercising none at this time, but plans to start walking within the next week. He is sleeping well.      No Known Allergies   Current Outpatient Medications:  .  acetaminophen (TYLENOL) 500 MG tablet, Take 1,500 mg by mouth every 6 (six) hours as needed for moderate pain or headache., Disp: , Rfl:     Review of Systems  Constitutional: Negative.   HENT: Negative.   Eyes: Negative.   Respiratory: Negative.   Cardiovascular: Negative.   Gastrointestinal: Negative.   Endocrine: Negative.   Genitourinary: Negative.   Musculoskeletal: Negative.   Skin: Negative.   Allergic/Immunologic: Negative.   Neurological: Negative.   Hematological: Negative.   Psychiatric/Behavioral: Positive for agitation. The patient is nervous/anxious.     Social History   Tobacco Use  . Smoking status: Never Smoker  . Smokeless tobacco: Former Systems developer    Types: Snuff  Substance Use Topics  . Alcohol use: Yes   Objective:   BP 136/84 (BP Location: Left Arm, Patient Position: Sitting, Cuff Size: Normal)   Pulse 94   Temp 98 F (36.7 C) (Oral)   Ht 6\' 2"  (1.88 m)   Wt (!) 387 lb (175.5 kg)   SpO2 98%   BMI 49.69 kg/m    Physical Exam  Constitutional: He appears well-developed and well-nourished. No distress.  HENT:  Head: Normocephalic and atraumatic.  Neck: Normal range of motion. Neck  supple.  Cardiovascular: Normal rate, regular rhythm and normal heart sounds. Exam reveals no gallop and no friction rub.  No murmur heard. Pulmonary/Chest: Effort normal and breath sounds normal. No respiratory distress. He has no wheezes. He has no rales.  Skin: He is not diaphoretic.  Psychiatric: His speech is normal and behavior is normal. Judgment normal. His mood appears anxious. Cognition and memory are normal. He exhibits a depressed mood. He expresses no homicidal and no suicidal ideation.  Vitals reviewed.      Assessment & Plan:     1. Encounter to establish care  2. Depression, major, single episode, mild (HCC) Worsening. Will add citalopram to see if benefits noted. I will see him back in 4 weeks.  - citalopram (CELEXA) 20 MG tablet; Take 1 tablet (20 mg total) by mouth daily.  Dispense: 30 tablet; Refill: [place], PA-C  [facility]

## 2017-09-11 ENCOUNTER — Ambulatory Visit: Payer: 59 | Admitting: Physician Assistant

## 2017-09-11 ENCOUNTER — Encounter: Payer: Self-pay | Admitting: Physician Assistant

## 2017-09-11 VITALS — BP 122/82 | HR 84 | Temp 97.6°F | Resp 16 | Ht 75.0 in | Wt 385.0 lb

## 2017-09-11 DIAGNOSIS — F320 Major depressive disorder, single episode, mild: Secondary | ICD-10-CM | POA: Diagnosis not present

## 2017-09-11 MED ORDER — CITALOPRAM HYDROBROMIDE 40 MG PO TABS: 40.0000 mg | ORAL_TABLET | Freq: Every day | ORAL | 1 refills | Status: DC

## 2017-09-11 NOTE — Progress Notes (Signed)
       Patient: Scott Aguilar Male    DOB: [birth year]   37 y.o.   MRN: [id number] Visit Date: 09/11/2017  Today's Provider: Mar Daring, PA-C   Chief Complaint  Patient presents with  . Depression   Subjective:    HPI  Depression, follow up: Patient comes in today for a follow up on depression. He was seen in the office 1 month ago. He is currently taking citalopram 20mg  daily. He reports good compliance and good symptom control. He feels that his symptoms are gradually improving.   No Known Allergies   Current Outpatient Medications:  .  acetaminophen (TYLENOL) 500 MG tablet, Take 1,500 mg by mouth every 6 (six) hours as needed for moderate pain or headache., Disp: , Rfl:  .  citalopram (CELEXA) 20 MG tablet, Take 1 tablet (20 mg total) by mouth daily., Disp: 30 tablet, Rfl: 1  Review of Systems  Constitutional: Negative for activity change, appetite change, chills, diaphoresis, fatigue, fever and unexpected weight change.  Neurological: Negative for headaches.  Psychiatric/Behavioral: Positive for dysphoric mood. Negative for agitation, confusion, decreased concentration, self-injury, sleep disturbance and suicidal ideas. The patient is not nervous/anxious and is not hyperactive.     Social History   Tobacco Use  . Smoking status: Never Smoker  . Smokeless tobacco: Former Systems developer    Types: Snuff  Substance Use Topics  . Alcohol use: Yes   Objective:   BP 122/82 (BP Location: Left Arm, Patient Position: Sitting, Cuff Size: Large)   Pulse 84   Temp 97.6 F (36.4 C)   Resp 16   Ht 6\' 3"  (1.905 m)   Wt (!) 385 lb (174.6 kg)   SpO2 95%   BMI 48.12 kg/m  Vitals:   09/11/17 1346  BP: 122/82  Pulse: 84  Resp: 16  Temp: 97.6 F (36.4 C)  SpO2: 95%  Weight: (!) 385 lb (174.6 kg)  Height: 6\' 3"  (1.905 m)     Physical Exam  Constitutional: He appears well-developed and well-nourished. No distress.  HENT:  Head: Normocephalic and atraumatic.  Neck:  Normal range of motion. Neck supple.  Cardiovascular: Normal rate, regular rhythm and normal heart sounds. Exam reveals no gallop and no friction rub.  No murmur heard. Pulmonary/Chest: Effort normal and breath sounds normal. No respiratory distress. He has no wheezes. He has no rales.  Skin: He is not diaphoretic.  Psychiatric: He has a normal mood and affect. His behavior is normal. Judgment and thought content normal.  Vitals reviewed.       Assessment & Plan:     1. Depression, major, single episode, mild (HCC) Will increase citalopram to 40mg  from 20mg  as below. He is to call if he has any adverse reaction to the increase. If not I will see him back in 4-6 weeks for CPE and recheck depression.  - citalopram (CELEXA) 40 MG tablet; Take 1 tablet (40 mg total) by mouth daily.  Dispense: 30 tablet; Refill: [place], PA-C  [facility]

## 2017-10-05 ENCOUNTER — Other Ambulatory Visit: Payer: Self-pay | Admitting: Physician Assistant

## 2017-10-05 DIAGNOSIS — F320 Major depressive disorder, single episode, mild: Secondary | ICD-10-CM

## 2017-10-25 ENCOUNTER — Encounter: Payer: Self-pay | Admitting: Physician Assistant

## 2017-10-25 ENCOUNTER — Ambulatory Visit (INDEPENDENT_AMBULATORY_CARE_PROVIDER_SITE_OTHER): Payer: 59 | Admitting: Physician Assistant

## 2017-10-25 VITALS — BP 120/80 | HR 76 | Temp 98.3°F | Resp 16 | Ht 75.0 in | Wt 388.5 lb

## 2017-10-25 DIAGNOSIS — F320 Major depressive disorder, single episode, mild: Secondary | ICD-10-CM | POA: Diagnosis not present

## 2017-10-25 DIAGNOSIS — Z0000 Encounter for general adult medical examination without abnormal findings: Secondary | ICD-10-CM

## 2017-10-25 DIAGNOSIS — Z6841 Body Mass Index (BMI) 40.0 and over, adult: Secondary | ICD-10-CM | POA: Diagnosis not present

## 2017-10-25 MED ORDER — CITALOPRAM HYDROBROMIDE 40 MG PO TABS: 40.0000 mg | ORAL_TABLET | Freq: Every day | ORAL | 1 refills | Status: DC

## 2017-10-25 NOTE — Progress Notes (Signed)
Patient: Scott Aguilar, Male    DOB: 28-Feb-1981, 37 y.o.   MRN: [id number] Visit Date: 10/25/2017  Today's Provider: Mar Daring, PA-C   Chief Complaint  Patient presents with  . Annual Exam   Subjective:    Annual physical exam Scott Aguilar is a 37 y.o. male who presents today for health maintenance and complete physical. He feels well. He reports exercising none. He reports he is sleeping well. -----------------------------------------------------------------   Review of Systems  Constitutional: Negative.   HENT: Negative.   Eyes: Negative.   Respiratory: Negative.   Cardiovascular: Negative.   Gastrointestinal: Negative.   Endocrine: Negative.   Genitourinary: Negative.   Musculoskeletal: Negative.   Skin: Negative.   Allergic/Immunologic: Negative.   Neurological: Negative.   Hematological: Negative.   Psychiatric/Behavioral: Negative.     Social History      He  reports that he has never smoked. He quit smokeless tobacco use about 9 months ago. His smokeless tobacco use included snuff. He reports that he drinks alcohol. He reports that he does not use drugs.       Social History   Socioeconomic History  . Marital status: Married    Spouse name: Not on file  . Number of children: Not on file  . Years of education: Not on file  . Highest education level: Not on file  Occupational History  . Not on file  Social Needs  . Financial resource strain: Not on file  . Food insecurity:    Worry: Not on file    Inability: Not on file  . Transportation needs:    Medical: Not on file    Non-medical: Not on file  Tobacco Use  . Smoking status: Never Smoker  . Smokeless tobacco: Former Systems developer    Types: Snuff  Substance and Sexual Activity  . Alcohol use: Yes  . Drug use: No  . Sexual activity: Yes  Lifestyle  . Physical activity:    Days per week: Not on file    Minutes per session: Not on file  . Stress: Not on file  Relationships  .  Social connections:    Talks on phone: Not on file    Gets together: Not on file    Attends religious service: Not on file    Active member of club or organization: Not on file    Attends meetings of clubs or organizations: Not on file    Relationship status: Not on file  Other Topics Concern  . Not on file  Social History Narrative  . Not on file    Past Medical History:  Diagnosis Date  . Acute diverticulitis 01/19/2017  . Asthma    Childhood asthma  . Cancer ([place]) 2013   melanoma back left shoulder  . Diverticulitis 01/07/2017  . Enteritis due to Clostridium difficile      Patient Active Problem List   Diagnosis Date Noted  . Depression, major, single episode, mild ([place]) 08/09/2017  . History of Clostridium difficile colitis   . Acute diverticulitis 01/19/2017  . Diverticulitis 01/07/2017    Past Surgical History:  Procedure Laterality Date  . COLONOSCOPY WITH PROPOFOL N/A 04/20/2017   Procedure: COLONOSCOPY WITH PROPOFOL;  Surgeon: Lin Landsman, MD;  Location: [facility] ENDOSCOPY;  Service: Gastroenterology;  Laterality: N/A;  . MELANOMA EXCISION  2013   Dr. Evorn Gong    Family History        Family Status  Relation Name Status  .  Father  Alive  . Mother  Alive  . Mat Uncle  (Not Specified)  . PGM  Deceased  . PGF  Deceased  . Sister  Alive  . Daughter  Alive  . Son  Alive  . MGM  Deceased  . MGF  Deceased        His family history includes Cancer in his maternal uncle and paternal grandmother; Depression in his mother; Heart disease in his father; Heart murmur in his sister; Hypertension in his father; Skin cancer in his paternal grandfather.      No Known Allergies   Current Outpatient Medications:  .  citalopram (CELEXA) 40 MG tablet, Take 1 tablet (40 mg total) by mouth daily., Disp: 90 tablet, Rfl: 1 .  loratadine (CLARITIN) 10 MG tablet, Take 10 mg by mouth daily., Disp: , Rfl:  .  acetaminophen (TYLENOL) 500 MG tablet, Take 1,500 mg by mouth  every 6 (six) hours as needed for moderate pain or headache., Disp: , Rfl:    Patient Care Team: Mar Daring, PA-C as PCP - General (Family Medicine)      Objective:   Vitals: BP 120/80 (BP Location: Left Wrist, Patient Position: Sitting, Cuff Size: Normal)   Pulse 76   Temp 98.3 F (36.8 C) (Oral)   Resp 16   Ht 6\' 3"  (1.905 m)   Wt (!) 388 lb 8 oz (176.2 kg)   SpO2 97%   BMI 48.56 kg/m    Physical Exam  Constitutional: He is oriented to person, place, and time. He appears well-developed and well-nourished.  obese  HENT:  Head: Normocephalic.  Right Ear: Hearing, tympanic membrane, external ear and ear canal normal.  Left Ear: Hearing, tympanic membrane, external ear and ear canal normal.  Nose: Nose normal.  Mouth/Throat: Uvula is midline, oropharynx is clear and moist and mucous membranes are normal.  Eyes: Conjunctivae and EOM are normal.  Neck: Normal range of motion.  Cardiovascular: Normal rate, regular rhythm, normal heart sounds and intact distal pulses.  Pulmonary/Chest: Effort normal and breath sounds normal.  Abdominal: Soft. Bowel sounds are normal.  Genitourinary: Penis normal.  Musculoskeletal: Normal range of motion.  Neurological: He is alert and oriented to person, place, and time.  Skin: Skin is warm.  Psychiatric: He has a normal mood and affect. His behavior is normal. Judgment and thought content normal.  Vitals reviewed.    Depression Screen PHQ 2/9 Scores 10/25/2017 08/09/2017  PHQ - 2 Score 1 2  PHQ- 9 Score 5 8      Assessment & Plan:     Routine Health Maintenance and Physical Exam  Exercise Activities and Dietary recommendations Goals    None      Immunization History  Administered Date(s) Administered  . Tdap 08/09/2017    Health Maintenance  Topic Date Due  . INFLUENZA VACCINE  04/11/2018 (Originally 12/21/2017)  . TETANUS/TDAP  08/10/2027  . HIV Screening  Completed     Discussed health benefits of physical  activity, and encouraged him to engage in regular exercise appropriate for his age and condition.    1. Annual physical exam Normal physical exam today. Will check labs as below and f/u pending lab results. If labs are stable and WNL she will not need to have these rechecked for one year at her next annual physical exam. She is to call the office in the meantime if she has any acute issue, questions or concerns. - CBC with Differential/Platelet - Comprehensive metabolic panel -  Hemoglobin A1c - Lipid panel - TSH  2. Depression, major, single episode, mild (HCC) Stable. Diagnosis pulled for medication refill. Continue current medical treatment plan. - TSH - citalopram (CELEXA) 40 MG tablet; Take 1 tablet (40 mg total) by mouth daily.  Dispense: 90 tablet; Refill: 1  3. Class 3 severe obesity due to excess calories without serious comorbidity with body mass index (BMI) of 45.0 to 49.9 in adult [facility]) Counseled patient on healthy lifestyle modifications including dieting and exercise.  - Comprehensive metabolic panel - Hemoglobin A1c - Lipid panel  --------------------------------------------------------------------    Mar Daring, PA-C  [facility]

## 2017-10-25 NOTE — Patient Instructions (Signed)

## 2017-10-26 ENCOUNTER — Telehealth: Payer: Self-pay

## 2017-10-26 LAB — CBC WITH DIFFERENTIAL/PLATELET
BASOS ABS: 0 10*3/uL (ref 0.0–0.2)
Basos: 1 %
EOS (ABSOLUTE): 0.2 10*3/uL (ref 0.0–0.4)
EOS: 4 %
HEMATOCRIT: 47.8 % (ref 37.5–51.0)
HEMOGLOBIN: 16.4 g/dL (ref 13.0–17.7)
Immature Grans (Abs): 0 10*3/uL (ref 0.0–0.1)
Immature Granulocytes: 0 %
Lymphocytes Absolute: 1.3 10*3/uL (ref 0.7–3.1)
Lymphs: 30 %
MCH: 28.9 pg (ref 26.6–33.0)
MCHC: 34.3 g/dL (ref 31.5–35.7)
MCV: 84 fL (ref 79–97)
MONOS ABS: 0.3 10*3/uL (ref 0.1–0.9)
Monocytes: 6 %
NEUTROS ABS: 2.7 10*3/uL (ref 1.4–7.0)
Neutrophils: 59 %
Platelets: 187 10*3/uL (ref 150–450)
RBC: 5.68 x10E6/uL (ref 4.14–5.80)
RDW: 14.6 % (ref 12.3–15.4)
WBC: 4.5 10*3/uL (ref 3.4–10.8)

## 2017-10-26 LAB — COMPREHENSIVE METABOLIC PANEL
ALBUMIN: 4.3 g/dL (ref 3.5–5.5)
ALK PHOS: 51 IU/L (ref 39–117)
ALT: 36 IU/L (ref 0–44)
AST: 25 IU/L (ref 0–40)
Albumin/Globulin Ratio: 1.8 (ref 1.2–2.2)
BILIRUBIN TOTAL: 0.4 mg/dL (ref 0.0–1.2)
BUN / CREAT RATIO: 16 (ref 9–20)
BUN: 14 mg/dL (ref 6–20)
CHLORIDE: 109 mmol/L — AB (ref 96–106)
CO2: 15 mmol/L — ABNORMAL LOW (ref 20–29)
Calcium: 9.2 mg/dL (ref 8.7–10.2)
Creatinine, Ser: 0.89 mg/dL (ref 0.76–1.27)
GFR calc Af Amer: 127 mL/min/{1.73_m2} (ref >59)
GFR calc non Af Amer: 110 mL/min/{1.73_m2} (ref >59)
GLOBULIN, TOTAL: 2.4 g/dL (ref 1.5–4.5)
Glucose: 95 mg/dL (ref 65–99)
POTASSIUM: 4.7 mmol/L (ref 3.5–5.2)
SODIUM: 141 mmol/L (ref 134–144)
Total Protein: 6.7 g/dL (ref 6.0–8.5)

## 2017-10-26 LAB — LIPID PANEL
Chol/HDL Ratio: 5 ratio (ref 0.0–5.0)
Cholesterol, Total: 184 mg/dL (ref 100–199)
HDL: 37 mg/dL — ABNORMAL LOW (ref >39)
LDL Calculated: 127 mg/dL — ABNORMAL HIGH (ref 0–99)
Triglycerides: 101 mg/dL (ref 0–149)
VLDL Cholesterol Cal: 20 mg/dL (ref 5–40)

## 2017-10-26 LAB — HEMOGLOBIN A1C
Est. average glucose Bld gHb Est-mCnc: 103 mg/dL
HEMOGLOBIN A1C: 5.2 % (ref 4.8–5.6)

## 2017-10-26 LAB — TSH: TSH: 2.57 u[IU]/mL (ref 0.450–4.500)

## 2017-10-26 NOTE — Telephone Encounter (Signed)
-----   Message from Mar Daring, PA-C sent at 10/26/2017  9:15 AM EDT ----- All labs are within normal limits and stable.  Thanks! -JB

## 2017-10-26 NOTE — Telephone Encounter (Signed)
Patient advised as below.  

## 2018-05-31 ENCOUNTER — Other Ambulatory Visit: Payer: Self-pay | Admitting: Physician Assistant

## 2018-05-31 DIAGNOSIS — F320 Major depressive disorder, single episode, mild: Secondary | ICD-10-CM

## 2018-05-31 NOTE — Telephone Encounter (Signed)
Please advise refill? 

## 2018-09-01 IMAGING — CT CT ABD-PELV W/ CM
2 of 4 series · 16 of 46 positions shown, 18 images · IV contrast (iopamidol)
Comparison: Abdominal ultrasound 06/04/2014

CLINICAL DATA: Lower abdominal pain.

EXAM:
CT ABDOMEN AND PELVIS WITH CONTRAST
TECHNIQUE: Multidetector CT imaging of the abdomen and pelvis was performed
using the standard protocol following bolus administration of
intravenous contrast.
CONTRAST:  125mL [id number] IOPAMIDOL ([id number]) INJECTION 61%

[Series 2: routine abd/pel with · axial · 0.98mm/px · z∈[-626,-101]mm · 13 of 115 slices shown, 15 images]
[im 5/115  soft-tissue]
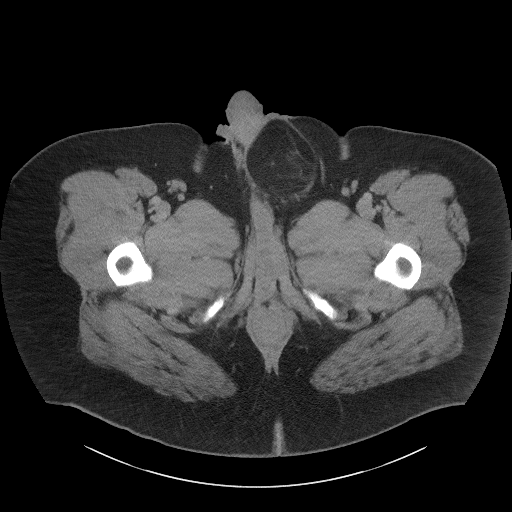
[im 5/115  bone]
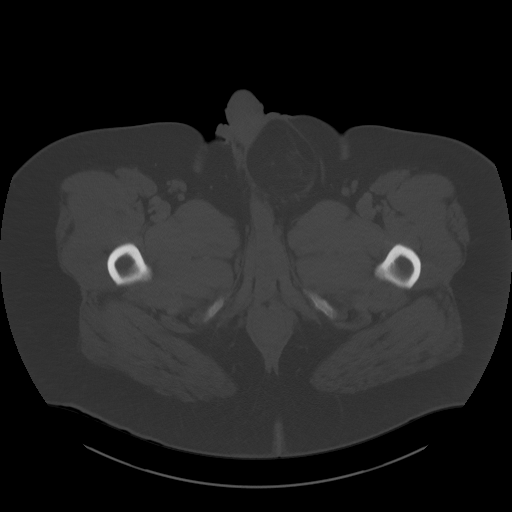
[im 15/115  soft-tissue]
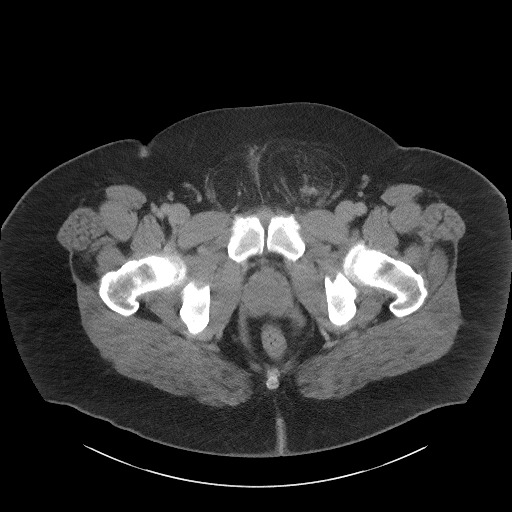
[im 25/115  soft-tissue]
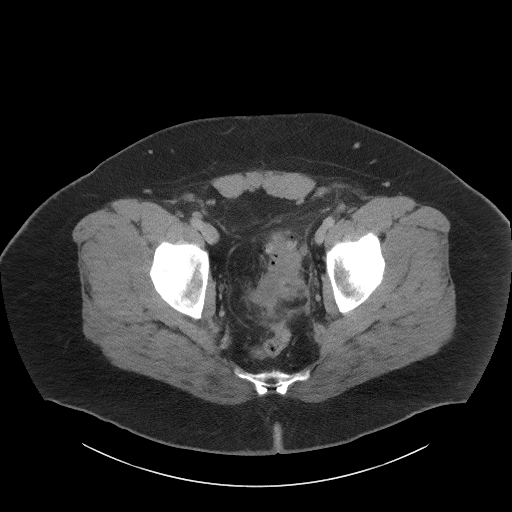
[im 30/115  soft-tissue]
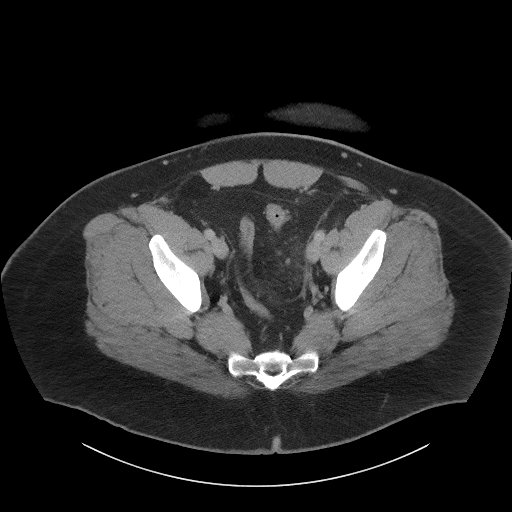
[im 40/115  soft-tissue]
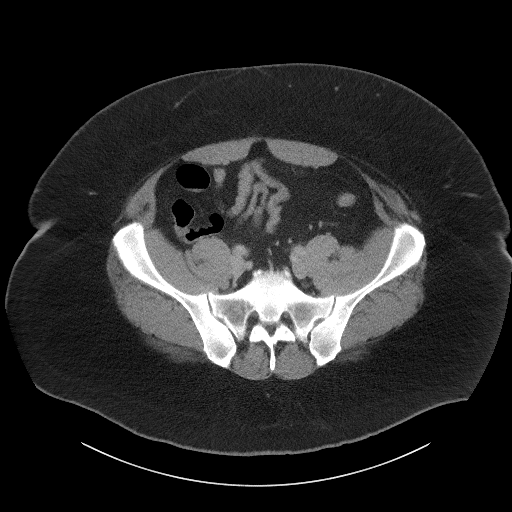
[im 50/115  soft-tissue]
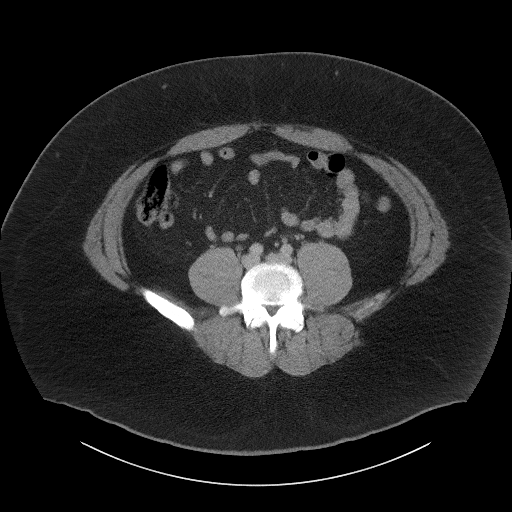
[im 60/115  soft-tissue]
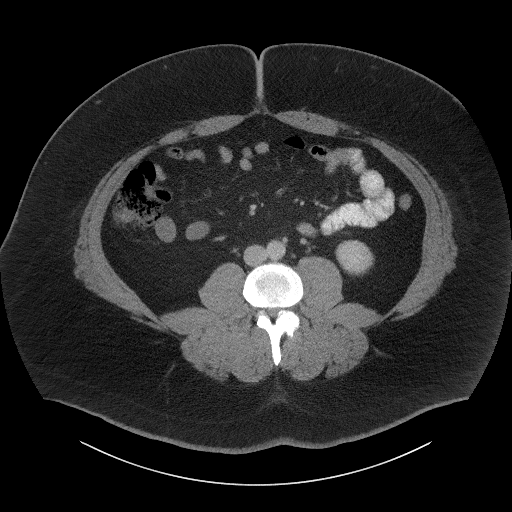
[im 65/115  soft-tissue]
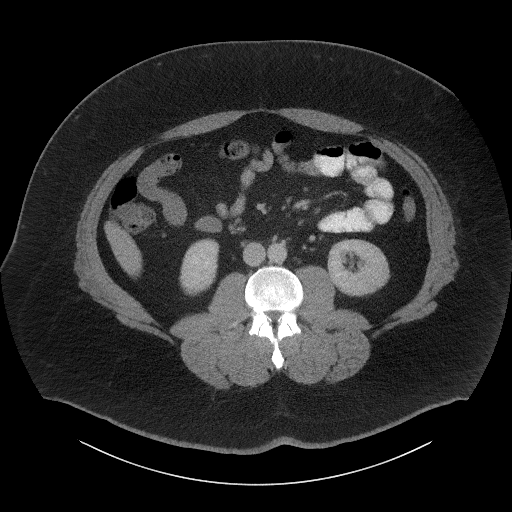
[im 75/115  soft-tissue]
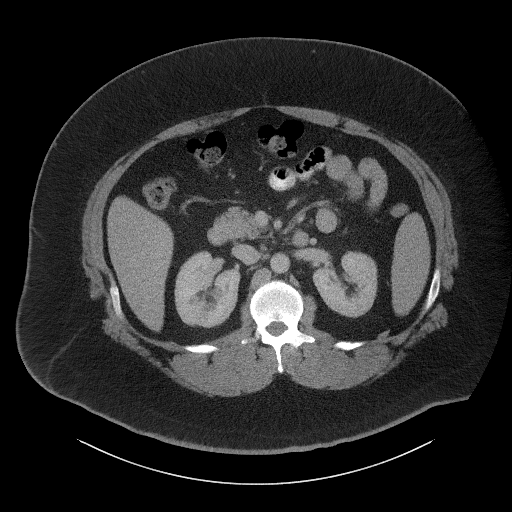
[im 75/115  bone]
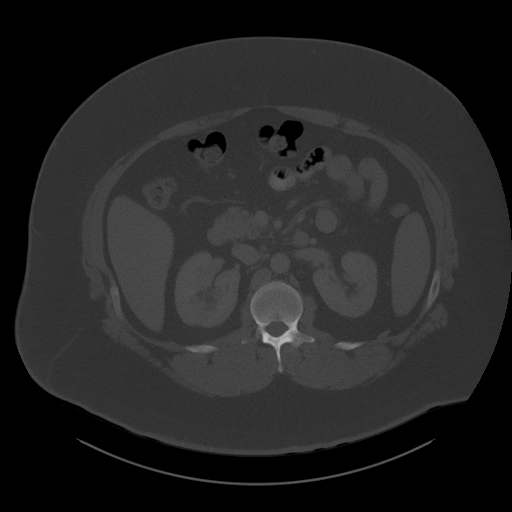
[im 85/115  soft-tissue]
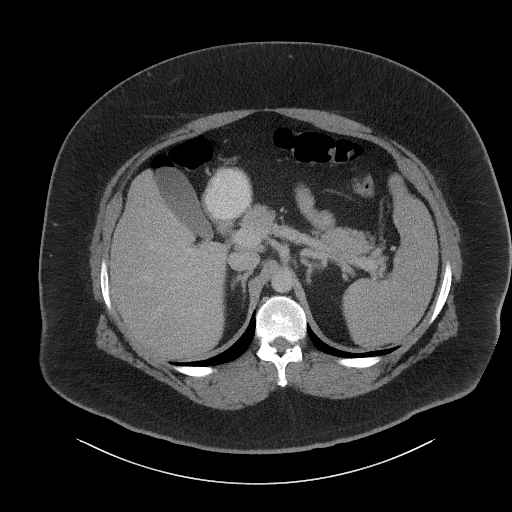
[im 90/115  soft-tissue]
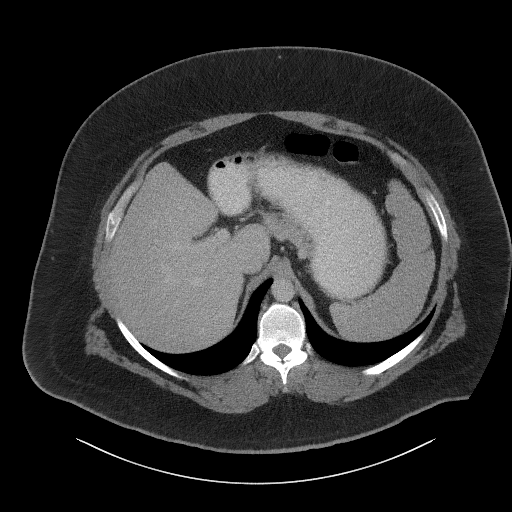
[im 100/115  soft-tissue]
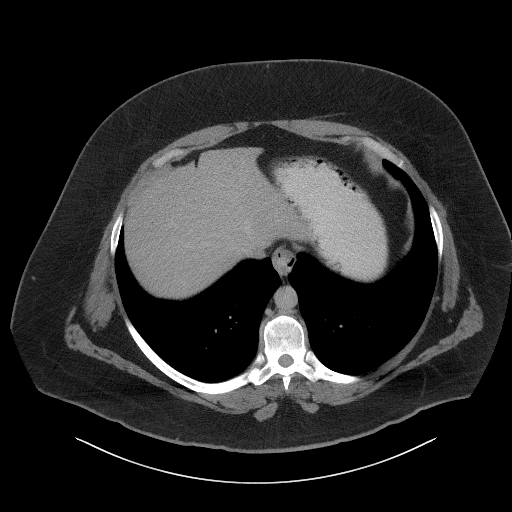
[im 110/115  soft-tissue]
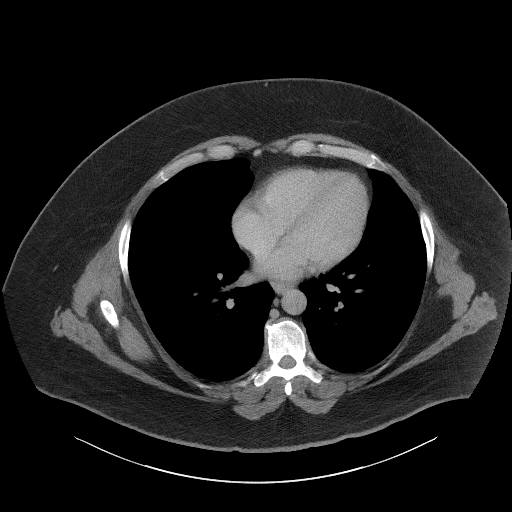

[Series 5: coronal st · coronal · 0.84mm/px · 3 of 112 slices shown]
[im 38/112  soft-tissue]
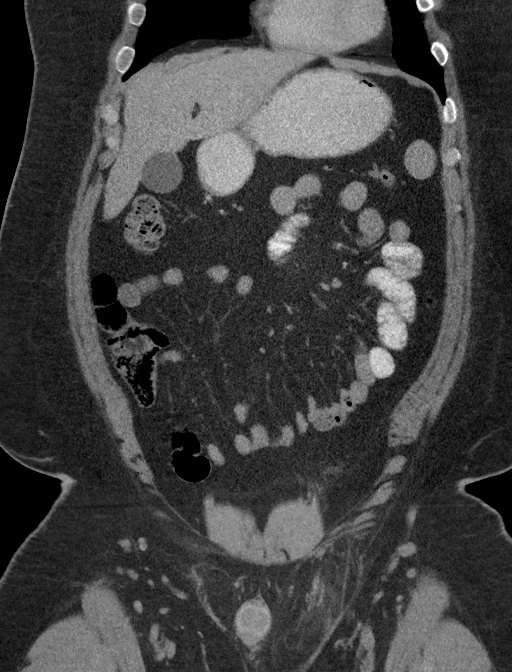
[im 50/112  soft-tissue]
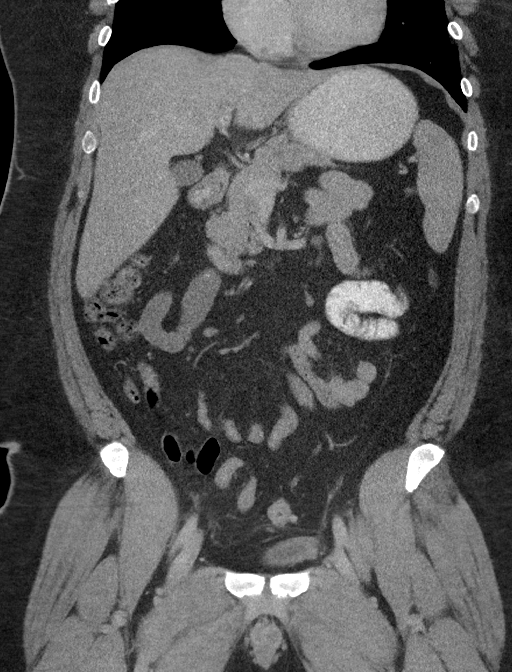
[im 62/112  soft-tissue]
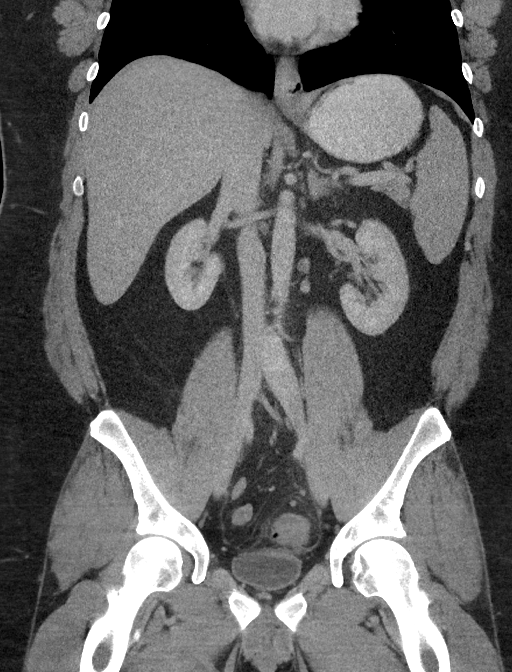

[16 of 46 positions shown; findings below may reference images not displayed]

FINDINGS: Lower chest: No acute abnormality.

Hepatobiliary: No focal liver abnormality is seen. No gallstones,
gallbladder wall thickening, or biliary dilatation.

Pancreas: Unremarkable. No pancreatic ductal dilatation or
surrounding inflammatory changes.

Spleen: Normal in size without focal abnormality.

Adrenals/Urinary Tract: Adrenal glands are unremarkable. Kidneys are
without renal calculi, focal lesion, or hydronephrosis. 12 mm right
renal cyst. Bladder is unremarkable.

Stomach/Bowel: There is a relatively short segment of irregular
mucosal thickening and pericolonic inflammatory changes in the
distal sigmoid colon/ proximal rectum, best seen on axial image
19/115, sequence 2 or coronal image 68/112, sequence 5. Scattered
diverticula are seen of the sigmoid colon.

Vascular/Lymphatic: No significant vascular findings are present. No
enlarged abdominal or pelvic lymph nodes.

Reproductive:  Normal prostate gland.

Other: Left indirect inguinal hernia contains peritoneal fat as well
as a small portion of the urinary bladder. There is an associated
fat stranding.

Musculoskeletal: No acute or significant osseous findings.
IMPRESSION: Relatively short segment of irregular mucosal thickening and
pericolonic inflammatory changes in the distal sigmoid colon/
proximal rectum on the background of scattered sigmoid
diverticulosis. This may represent diverticulitis, however
underlying colonic mass cannot be excluded.

Indirect left inguinal hernia containing peritoneal fat and a small
portion of the urinary bladder with fat stranding which may be due
to inflammatory changes or potentially a strangulation.

These results were called by telephone at the time of interpretation
on 01/07/2017 at [DATE] to Dr. PARDIANTO RADEN , who verbally
acknowledged these results.

## 2018-09-15 ENCOUNTER — Other Ambulatory Visit: Payer: Self-pay | Admitting: Physician Assistant

## 2018-09-15 DIAGNOSIS — F320 Major depressive disorder, single episode, mild: Secondary | ICD-10-CM

## 2018-10-02 IMAGING — CT CT ABD-PELV W/ CM
2 of 7 series · 14 of 46 positions shown, 18 images · IV contrast (APPLIED)
Comparison: CT abdomen and pelvis 02/19/2017 and 01/19/2017.

CLINICAL DATA: Lower abdominal pain. Pain with urination. Patient
was discharged from the hospital 01/26/2017 after admission for
diverticulitis.

EXAM:
CT ABDOMEN AND PELVIS WITH CONTRAST
TECHNIQUE: Multidetector CT imaging of the abdomen and pelvis was performed
using the standard protocol following bolus administration of
intravenous contrast.
CONTRAST:  125 cc Isovue 370.

[Series 2: routine abd/pel with · axial · 0.98mm/px · z∈[-1148,-638]mm · 11 of 116 slices shown, 15 images]
[im 7/116  soft-tissue]
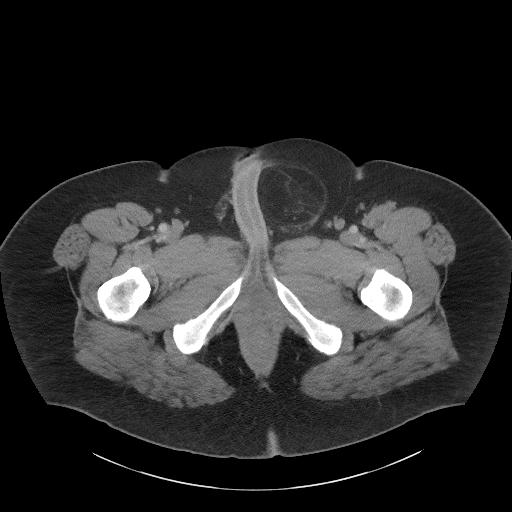
[im 7/116  bone]
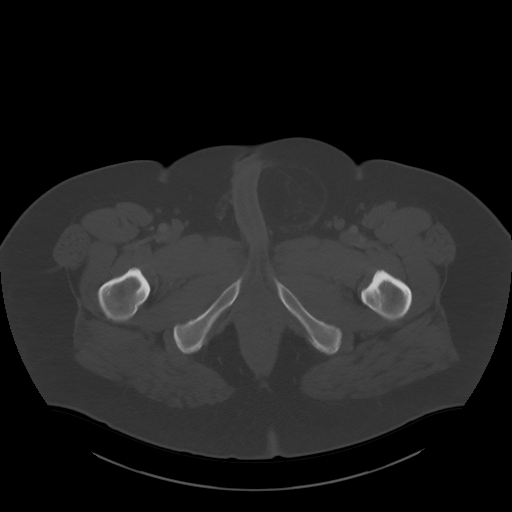
[im 21/116  soft-tissue]
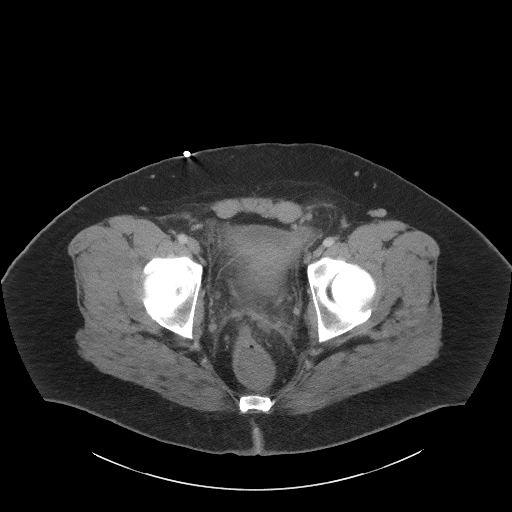
[im 34/116  soft-tissue]
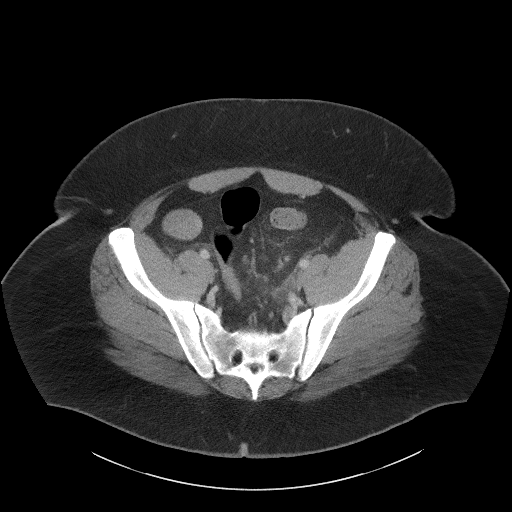
[im 48/116  soft-tissue]
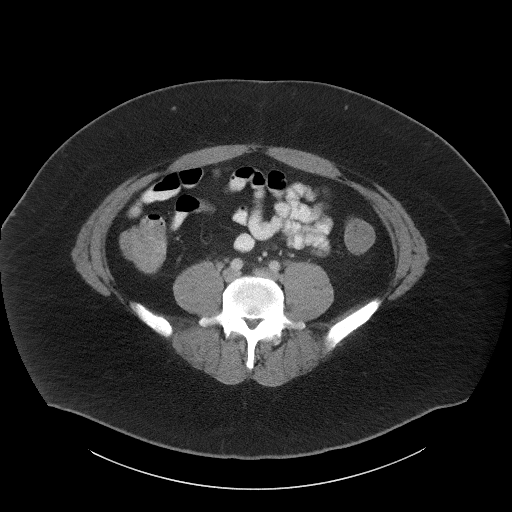
[im 61/116  soft-tissue]
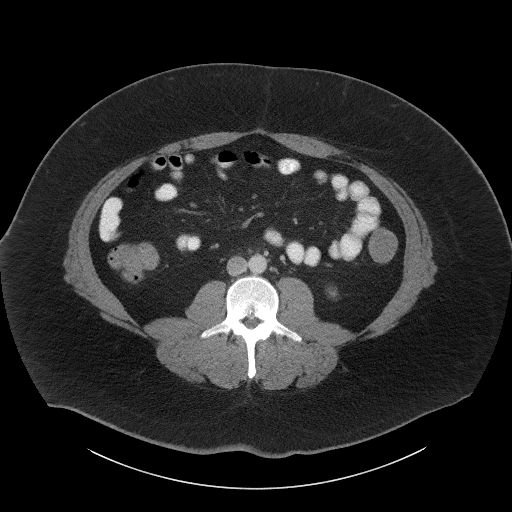
[im 68/116  soft-tissue]
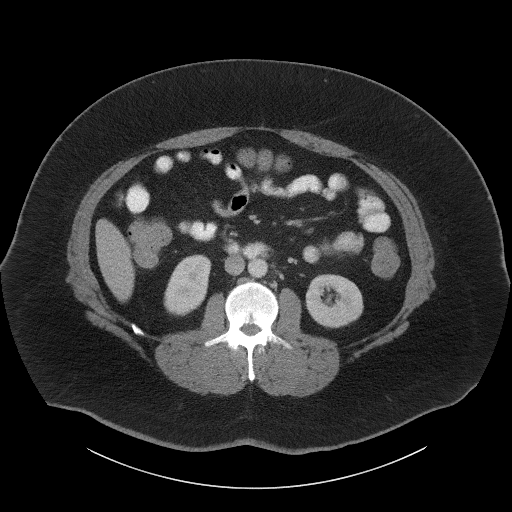
[im 82/116  soft-tissue]
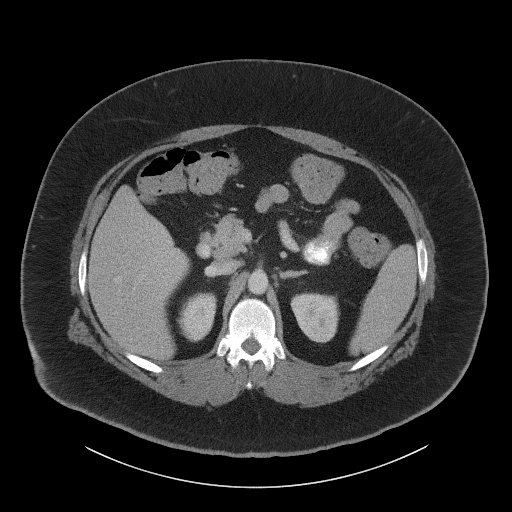
[im 88/116  lung]
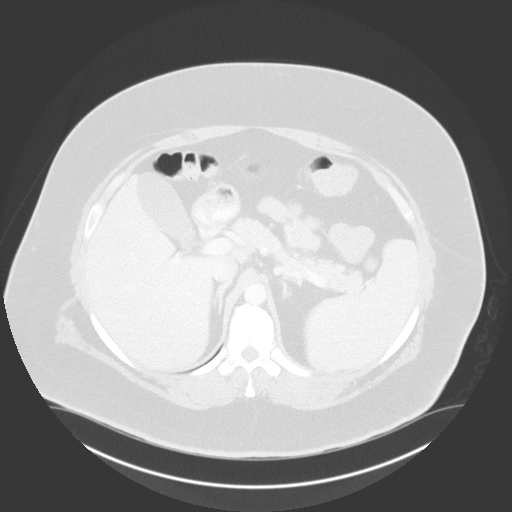
[im 95/116  soft-tissue]
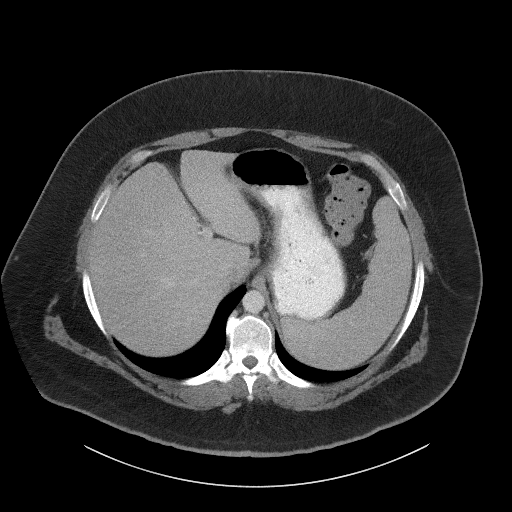
[im 95/116  lung]
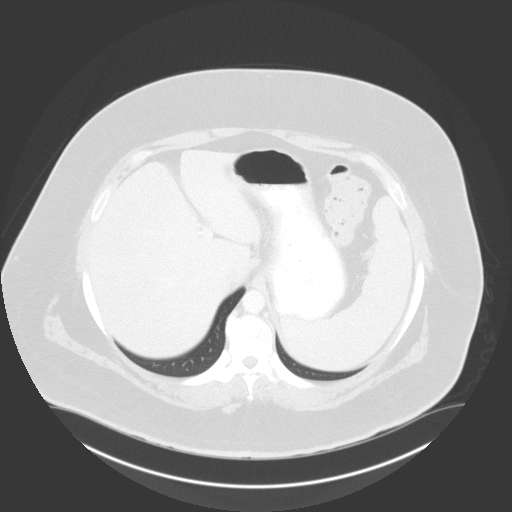
[im 102/116  lung]
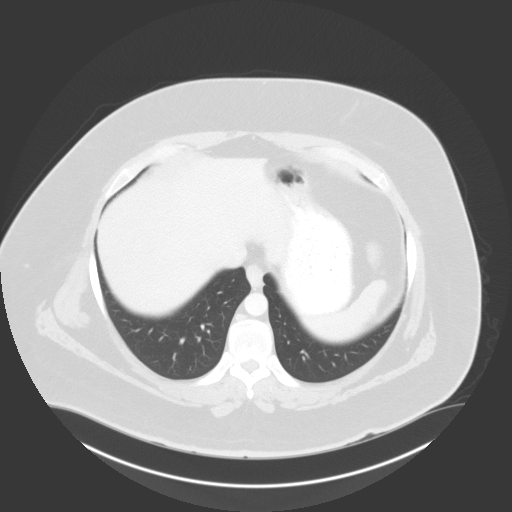
[im 109/116  soft-tissue]
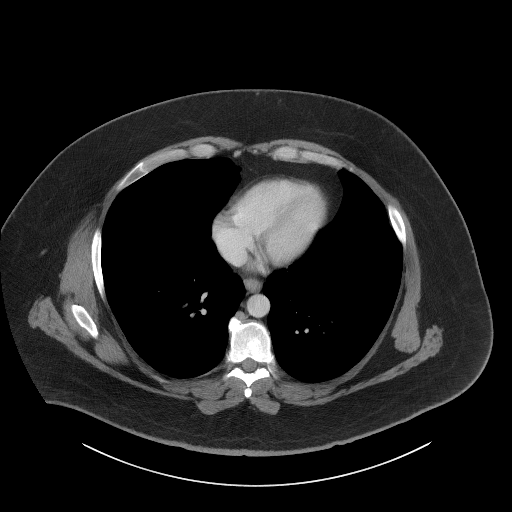
[im 109/116  lung]
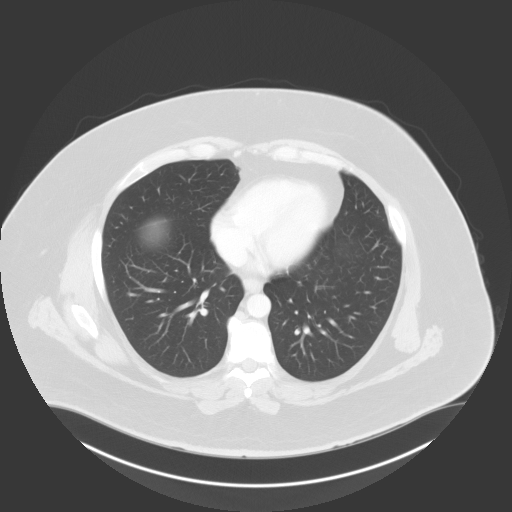
[im 109/116  bone]
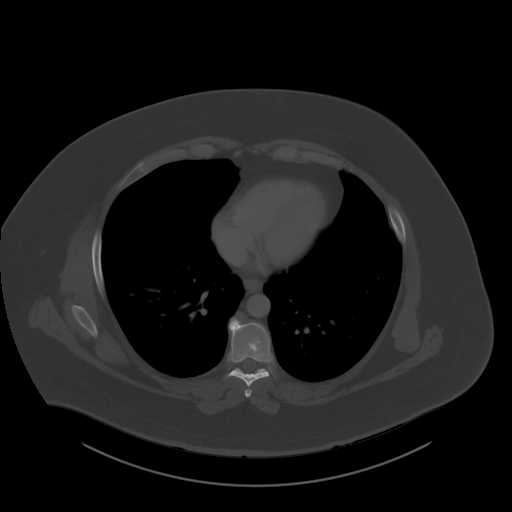

[Series 7: coronal st · coronal · 1.11mm/px · 3 of 118 slices shown]
[im 30/118  soft-tissue]
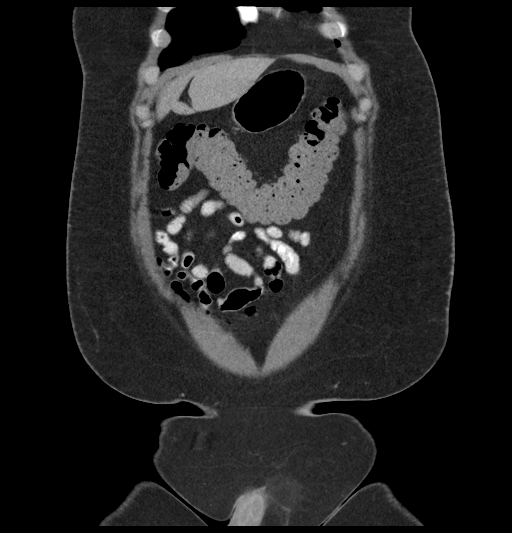
[im 59/118  soft-tissue]
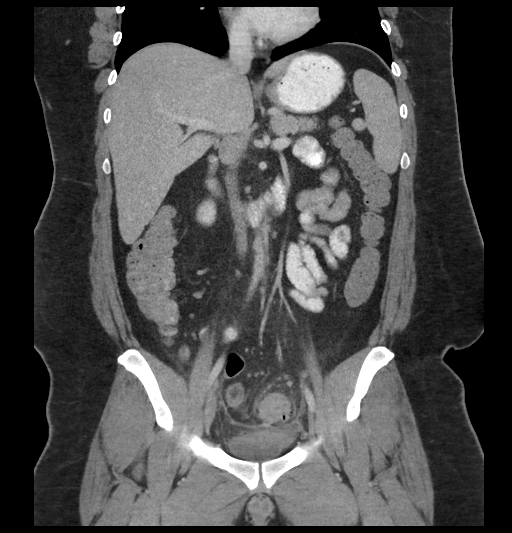
[im 88/118  soft-tissue]
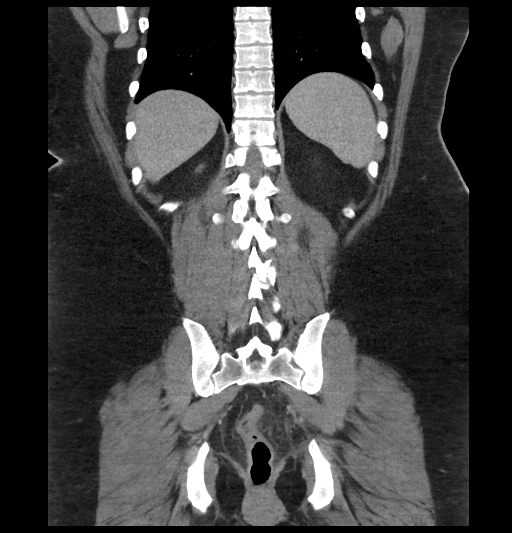

[14 of 46 positions shown; findings below may reference images not displayed]

FINDINGS: Lower chest: Lung bases are clear. Heart size is normal. No pleural
or pericardial effusion.

Hepatobiliary: No focal liver abnormality is seen. No gallstones,
gallbladder wall thickening, or biliary dilatation.

Pancreas: Unremarkable. No pancreatic ductal dilatation or
surrounding inflammatory changes.

Spleen: Normal in size without focal abnormality.

Adrenals/Urinary Tract: The kidneys otherwise appear normal. Mild
stranding is seen about the distal left ureter. Mild hydronephrosis
on the left seen on the most recent CT has resolved. The adrenal
glands are unremarkable.

Stomach/Bowel: Sigmoid diverticulosis with marked wall thickening
and extensive stranding about the mid sigmoid colon is again seen.
The appearance is somewhat improved compared to the most recent CT
scan. No abscess or perforation is identified. The colon is
otherwise normal appearance. The appendix appears normal. The
stomach and small bowel are unremarkable.

Vascular/Lymphatic: No significant vascular findings are present. No
enlarged abdominal or pelvic lymph nodes.

Reproductive: Prostate is unremarkable.

Other: Bilateral fat containing inguinal hernias are seen. Hernia on
the left is very large. The appearance is unchanged.

Musculoskeletal: Negative.
IMPRESSION: Extensive inflammatory change and wall thickening about the sigmoid
colon persists but does show some improvement since the most recent
CT scan. No abscess or perforation.

Mild stranding about distal left ureter is likely secondary to
sigmoid inflammatory change. Mild hydronephrosis on the left on the
most recent CT scan has resolved.

No change in bilateral fat containing inguinal hernias, very large
on the left.

## 2018-10-26 NOTE — Progress Notes (Signed)
Patient: Scott Aguilar, Male    DOB: 02-22-1981, 38 y.o.   MRN: [id number] Visit Date: 10/29/2018  Today's Provider: Mar Daring, PA-C   Chief Complaint  Patient presents with  . Annual Exam   Subjective:     Annual physical exam Scott Aguilar is a 38 y.o. male who presents today for health maintenance and complete physical. He feels well. He reports exercising minimal. He reports he is sleeping fairly well. -----------------------------------------------------------------   Review of Systems  Constitutional: Negative.   HENT: Negative.   Eyes: Negative.   Respiratory: Negative.   Cardiovascular: Negative.   Gastrointestinal: Negative.   Endocrine: Negative.   Genitourinary: Negative.   Musculoskeletal: Negative.   Skin: Negative.   Allergic/Immunologic: Negative.   Neurological: Negative.   Hematological: Negative.   Psychiatric/Behavioral: Negative.     Social History      He  reports that he has never smoked. He quit smokeless tobacco use about 21 months ago.  His smokeless tobacco use included snuff. He reports current alcohol use. He reports that he does not use drugs.       Social History   Socioeconomic History  . Marital status: Married    Spouse name: Not on file  . Number of children: Not on file  . Years of education: Not on file  . Highest education level: Not on file  Occupational History  . Not on file  Social Needs  . Financial resource strain: Not on file  . Food insecurity:    Worry: Not on file    Inability: Not on file  . Transportation needs:    Medical: Not on file    Non-medical: Not on file  Tobacco Use  . Smoking status: Never Smoker  . Smokeless tobacco: Former Systems developer    Types: Snuff  Substance and Sexual Activity  . Alcohol use: Yes  . Drug use: No  . Sexual activity: Yes  Lifestyle  . Physical activity:    Days per week: Not on file    Minutes per session: Not on file  . Stress: Not on file  Relationships   . Social connections:    Talks on phone: Not on file    Gets together: Not on file    Attends religious service: Not on file    Active member of club or organization: Not on file    Attends meetings of clubs or organizations: Not on file    Relationship status: Not on file  Other Topics Concern  . Not on file  Social History Narrative  . Not on file    Past Medical History:  Diagnosis Date  . Acute diverticulitis 01/19/2017  . Asthma    Childhood asthma  . Cancer ([place]) 2013   melanoma back left shoulder  . Diverticulitis 01/07/2017  . Enteritis due to Clostridium difficile      Patient Active Problem List   Diagnosis Date Noted  . Depression, major, single episode, mild ([place]) 08/09/2017  . History of Clostridium difficile colitis   . Acute diverticulitis 01/19/2017  . Diverticulitis 01/07/2017    Past Surgical History:  Procedure Laterality Date  . COLONOSCOPY WITH PROPOFOL N/A 04/20/2017   Procedure: COLONOSCOPY WITH PROPOFOL;  Surgeon: Lin Landsman, MD;  Location: [facility] ENDOSCOPY;  Service: Gastroenterology;  Laterality: N/A;  . MELANOMA EXCISION  2013   Dr. Evorn Gong    Family History        Family Status  Relation Name  Status  . Father  Alive  . Mother  Alive  . Mat Uncle  (Not Specified)  . PGM  Deceased  . PGF  Deceased  . Sister  Alive  . Daughter  Alive  . Son  Alive  . MGM  Deceased  . MGF  Deceased        His family history includes Cancer in his maternal uncle and paternal grandmother; Depression in his mother; Heart disease in his father; Heart murmur in his sister; Hypertension in his father; Skin cancer in his paternal grandfather.      No Known Allergies   Current Outpatient Medications:  .  acetaminophen (TYLENOL) 500 MG tablet, Take 1,500 mg by mouth every 6 (six) hours as needed for moderate pain or headache., Disp: , Rfl:  .  citalopram (CELEXA) 40 MG tablet, Take 1 tablet (40 mg total) by mouth daily., Disp: 90 tablet, Rfl: 3 .   loratadine (CLARITIN) 10 MG tablet, Take 10 mg by mouth daily., Disp: , Rfl:    Patient Care Team: Mar Daring, PA-C as PCP - General (Family Medicine)    Objective:    Vitals: BP 124/80 (BP Location: Left Arm, Patient Position: Sitting, Cuff Size: Large)   Pulse 75   Temp 97.8 F (36.6 C) (Oral)   Ht 6\' 3"  (1.905 m)   Wt (!) 393 lb 8 oz (178.5 kg)   SpO2 98%   BMI 49.18 kg/m    Vitals:   10/29/18 0823 10/29/18 0848  BP: (!) 139/94 124/80  Pulse: 75   Temp: 97.8 F (36.6 C)   TempSrc: Oral   SpO2: 98%   Weight: (!) 393 lb 8 oz (178.5 kg)   Height: 6\' 3"  (1.905 m)      Physical Exam Vitals signs reviewed.  Constitutional:      General: He is not in acute distress.    Appearance: Normal appearance. He is well-developed. He is obese. He is not ill-appearing.  HENT:     Head: Normocephalic and atraumatic.     Right Ear: Tympanic membrane, ear canal and external ear normal.     Left Ear: Tympanic membrane, ear canal and external ear normal.     Nose: Nose normal.     Mouth/Throat:     Mouth: Mucous membranes are moist.  Eyes:     General: No scleral icterus.       Right eye: No discharge.     Extraocular Movements: Extraocular movements intact.     Conjunctiva/sclera: Conjunctivae normal.     Pupils: Pupils are equal, round, and reactive to light.  Neck:     Musculoskeletal: Normal range of motion and neck supple.     Thyroid: No thyromegaly.     Trachea: No tracheal deviation.  Cardiovascular:     Rate and Rhythm: Normal rate and regular rhythm.     Pulses: Normal pulses.     Heart sounds: Normal heart sounds. No murmur.  Pulmonary:     Effort: Pulmonary effort is normal. No respiratory distress.     Breath sounds: Normal breath sounds. No wheezing or rales.  Chest:     Chest wall: No tenderness.  Abdominal:     General: There is no distension.     Palpations: Abdomen is soft. There is no mass.     Tenderness: There is no abdominal tenderness.  There is no guarding or rebound.  Musculoskeletal: Normal range of motion.        General: No  tenderness.  Lymphadenopathy:     Cervical: No cervical adenopathy.  Skin:    General: Skin is warm and dry.     Findings: No erythema or rash.  Neurological:     General: No focal deficit present.     Mental Status: He is alert and oriented to person, place, and time. Mental status is at baseline.     Cranial Nerves: No cranial nerve deficit.     Motor: No weakness or abnormal muscle tone.     Coordination: Coordination normal.     Gait: Gait normal.     Deep Tendon Reflexes: Reflexes are normal and symmetric. Reflexes normal.  Psychiatric:        Mood and Affect: Mood normal.        Behavior: Behavior normal.        Thought Content: Thought content normal.        Judgment: Judgment normal.      Depression Screen PHQ 2/9 Scores 10/29/2018 10/25/2017 08/09/2017  PHQ - 2 Score 1 1 2   PHQ- 9 Score 1 5 8        Assessment & Plan:     Routine Health Maintenance and Physical Exam  Exercise Activities and Dietary recommendations Goals   None     Immunization History  Administered Date(s) Administered  . Tdap 08/09/2017    Health Maintenance  Topic Date Due  . INFLUENZA VACCINE  12/22/2018  . TETANUS/TDAP  08/10/2027  . HIV Screening  Completed     Discussed health benefits of physical activity, and encouraged him to engage in regular exercise appropriate for his age and condition.    1. Annual physical exam Normal physical exam today. Will check labs as below and f/u pending lab results. If labs are stable and WNL he will not need to have these rechecked for one year at his next annual physical exam. He is to call the office in the meantime if he has any acute issue, questions or concerns. - CBC w/Diff/Platelet - Comprehensive Metabolic Panel (CMET) - Lipid Profile  2. Class 3 severe obesity due to excess calories with serious comorbidity and body mass index (BMI) of 45.0  to 49.9 in adult [facility]) Counseled patient on healthy lifestyle modifications including dieting and exercise.  - CBC w/Diff/Platelet - Comprehensive Metabolic Panel (CMET) - Lipid Profile  3. Depression, major, single episode, mild (HCC) Stable. Diagnosis pulled for medication refill. Continue current medical treatment plan. Will check labs as below and f/u pending results. - CBC w/Diff/Platelet - citalopram (CELEXA) 40 MG tablet; Take 1 tablet (40 mg total) by mouth daily.  Dispense: 90 tablet; Refill: 3  4. Other hyperlipidemia Diet controlled. Will check labs as below and f/u pending results. - CBC w/Diff/Platelet - Comprehensive Metabolic Panel (CMET) - Lipid Profile   --------------------------------------------------------------------    Mar Daring, PA-C  [facility]

## 2018-10-29 ENCOUNTER — Other Ambulatory Visit: Payer: Self-pay

## 2018-10-29 ENCOUNTER — Encounter: Payer: Self-pay | Admitting: Physician Assistant

## 2018-10-29 ENCOUNTER — Ambulatory Visit (INDEPENDENT_AMBULATORY_CARE_PROVIDER_SITE_OTHER): Payer: 59 | Admitting: Physician Assistant

## 2018-10-29 VITALS — BP 124/80 | HR 75 | Temp 97.8°F | Ht 75.0 in | Wt 393.5 lb

## 2018-10-29 DIAGNOSIS — Z6841 Body Mass Index (BMI) 40.0 and over, adult: Secondary | ICD-10-CM

## 2018-10-29 DIAGNOSIS — E7849 Other hyperlipidemia: Secondary | ICD-10-CM | POA: Diagnosis not present

## 2018-10-29 DIAGNOSIS — Z0000 Encounter for general adult medical examination without abnormal findings: Secondary | ICD-10-CM | POA: Diagnosis not present

## 2018-10-29 DIAGNOSIS — F320 Major depressive disorder, single episode, mild: Secondary | ICD-10-CM

## 2018-10-29 DIAGNOSIS — E66813 Obesity, class 3: Secondary | ICD-10-CM

## 2018-10-29 MED ORDER — CITALOPRAM HYDROBROMIDE 40 MG PO TABS: 40.0000 mg | ORAL_TABLET | Freq: Every day | ORAL | 3 refills | Status: DC

## 2018-10-29 NOTE — Patient Instructions (Signed)

## 2018-10-30 LAB — COMPREHENSIVE METABOLIC PANEL
ALT: 42 IU/L (ref 0–44)
AST: 31 IU/L (ref 0–40)
Albumin/Globulin Ratio: 2 (ref 1.2–2.2)
Albumin: 4.5 g/dL (ref 4.0–5.0)
Alkaline Phosphatase: 56 IU/L (ref 39–117)
BUN/Creatinine Ratio: 12 (ref 9–20)
BUN: 12 mg/dL (ref 6–20)
Bilirubin Total: 0.5 mg/dL (ref 0.0–1.2)
CO2: 22 mmol/L (ref 20–29)
Calcium: 9.2 mg/dL (ref 8.7–10.2)
Chloride: 99 mmol/L (ref 96–106)
Creatinine, Ser: 1.01 mg/dL (ref 0.76–1.27)
GFR calc Af Amer: 109 mL/min/{1.73_m2} (ref >59)
GFR calc non Af Amer: 95 mL/min/{1.73_m2} (ref >59)
Globulin, Total: 2.2 g/dL (ref 1.5–4.5)
Glucose: 109 mg/dL — ABNORMAL HIGH (ref 65–99)
Potassium: 4.9 mmol/L (ref 3.5–5.2)
Sodium: 138 mmol/L (ref 134–144)
Total Protein: 6.7 g/dL (ref 6.0–8.5)

## 2018-10-30 LAB — CBC WITH DIFFERENTIAL/PLATELET
Basophils Absolute: 0.1 10*3/uL (ref 0.0–0.2)
Basos: 1 %
EOS (ABSOLUTE): 0.1 10*3/uL (ref 0.0–0.4)
Eos: 2 %
Hematocrit: 49.1 % (ref 37.5–51.0)
Hemoglobin: 16.5 g/dL (ref 13.0–17.7)
Immature Grans (Abs): 0 10*3/uL (ref 0.0–0.1)
Immature Granulocytes: 0 %
Lymphocytes Absolute: 1.3 10*3/uL (ref 0.7–3.1)
Lymphs: 22 %
MCH: 28.5 pg (ref 26.6–33.0)
MCHC: 33.6 g/dL (ref 31.5–35.7)
MCV: 85 fL (ref 79–97)
Monocytes Absolute: 0.2 10*3/uL (ref 0.1–0.9)
Monocytes: 4 %
Neutrophils Absolute: 4.1 10*3/uL (ref 1.4–7.0)
Neutrophils: 71 %
Platelets: 216 10*3/uL (ref 150–450)
RBC: 5.79 x10E6/uL (ref 4.14–5.80)
RDW: 13.4 % (ref 11.6–15.4)
WBC: 5.8 10*3/uL (ref 3.4–10.8)

## 2018-10-30 LAB — LIPID PANEL
Chol/HDL Ratio: 4.9 ratio (ref 0.0–5.0)
Cholesterol, Total: 161 mg/dL (ref 100–199)
HDL: 33 mg/dL — ABNORMAL LOW (ref >39)
LDL Calculated: 107 mg/dL — ABNORMAL HIGH (ref 0–99)
Triglycerides: 103 mg/dL (ref 0–149)
VLDL Cholesterol Cal: 21 mg/dL (ref 5–40)

## 2019-12-04 ENCOUNTER — Other Ambulatory Visit: Payer: Self-pay | Admitting: Physician Assistant

## 2019-12-04 DIAGNOSIS — F320 Major depressive disorder, single episode, mild: Secondary | ICD-10-CM

## 2019-12-04 NOTE — Telephone Encounter (Signed)
Requested medications are due for refill today?  Yes  Requested medications are on active medication list?  Yes  Last Refill:   10/29/2018 # 90 with 3 refills  Future visit scheduled?  No   Notes to Clinic:  Medication failed RX refill protocol due to no valid encounter in the past 6 months.  Last visit was 10/29/2018.

## 2019-12-30 ENCOUNTER — Other Ambulatory Visit: Payer: Self-pay | Admitting: Physician Assistant

## 2019-12-30 DIAGNOSIS — F320 Major depressive disorder, single episode, mild: Secondary | ICD-10-CM

## 2019-12-31 ENCOUNTER — Other Ambulatory Visit: Payer: Self-pay | Admitting: Physician Assistant

## 2019-12-31 DIAGNOSIS — F320 Major depressive disorder, single episode, mild: Secondary | ICD-10-CM

## 2020-01-19 ENCOUNTER — Other Ambulatory Visit: Payer: Self-pay | Admitting: Physician Assistant

## 2020-01-19 DIAGNOSIS — F320 Major depressive disorder, single episode, mild: Secondary | ICD-10-CM

## 2020-01-19 NOTE — Telephone Encounter (Signed)
Requested medication (s) are due for refill today: no  Requested medication (s) are on the active medication list: yes   Last refill:  12/04/2019  Future visit scheduled: no  Notes to clinic:  overdue for follow up Last appointment was 10/29/2018   Requested Prescriptions  Pending Prescriptions Disp Refills   citalopram (CELEXA) 40 MG tablet [Pharmacy Med Name: CITALOPRAM HBR 40 MG TABLET] 30 tablet 0    Sig: TAKE 1 TABLET BY MOUTH EVERY DAY      Psychiatry:  Antidepressants - SSRI Failed - 01/19/2020  2:13 PM      Failed - Completed PHQ-2 or PHQ-9 in the last 360 days.      Failed - Valid encounter within last 6 months    Recent Outpatient Visits           1 year ago Annual physical exam   [facility], [place], [place]   2 years ago Annual physical exam   [facility] [place], [place]   2 years ago Depression, major, single episode, mild [facility])   [place], [place]   2 years ago Depression, major, single episode, mild [facility])   [facility], [place], [place]

## 2020-01-20 NOTE — Telephone Encounter (Signed)
Please advise refill? Patient has not been seen since 10/29/2018.

## 2020-01-20 NOTE — Telephone Encounter (Signed)
Needs CPE appt

## 2020-02-17 ENCOUNTER — Other Ambulatory Visit: Payer: Self-pay | Admitting: Physician Assistant

## 2020-02-17 DIAGNOSIS — F320 Major depressive disorder, single episode, mild: Secondary | ICD-10-CM

## 2020-02-17 NOTE — Telephone Encounter (Signed)
Requested medication (s) are due for refill today: yes  Requested medication (s) are on the active medication list: yes  Last refill: 01/19/20  #30  0 refills  Future visit scheduled: No  Notes to clinic:  Called and left VM to return call to office Needs CPE scheduled .Last CPE was 10/29/18.    Requested Prescriptions  Pending Prescriptions Disp Refills   citalopram (CELEXA) 40 MG tablet [Pharmacy Med Name: CITALOPRAM HBR 40 MG TABLET] 30 tablet 0    Sig: TAKE 1 TABLET BY MOUTH EVERY DAY      Psychiatry:  Antidepressants - SSRI Failed - 02/17/2020  1:33 AM      Failed - Completed PHQ-2 or PHQ-9 in the last 360 days.      Failed - Valid encounter within last 6 months    Recent Outpatient Visits           1 year ago Annual physical exam   [facility] [place], [place], [place]   2 years ago Annual physical exam   [facility] [place], [place]   2 years ago Depression, major, single episode, mild [facility])   [place], [place]   2 years ago Depression, major, single episode, mild [facility])   [facility], [place], [place]

## 2020-02-28 ENCOUNTER — Ambulatory Visit (INDEPENDENT_AMBULATORY_CARE_PROVIDER_SITE_OTHER): Payer: 59 | Admitting: Physician Assistant

## 2020-02-28 ENCOUNTER — Other Ambulatory Visit: Payer: Self-pay

## 2020-02-28 ENCOUNTER — Encounter: Payer: Self-pay | Admitting: Physician Assistant

## 2020-02-28 VITALS — BP 140/80 | HR 80 | Temp 98.3°F | Resp 16 | Ht 75.0 in | Wt 346.0 lb

## 2020-02-28 DIAGNOSIS — Z1159 Encounter for screening for other viral diseases: Secondary | ICD-10-CM | POA: Diagnosis not present

## 2020-02-28 DIAGNOSIS — F320 Major depressive disorder, single episode, mild: Secondary | ICD-10-CM

## 2020-02-28 DIAGNOSIS — Z0000 Encounter for general adult medical examination without abnormal findings: Secondary | ICD-10-CM | POA: Diagnosis not present

## 2020-02-28 DIAGNOSIS — Z6841 Body Mass Index (BMI) 40.0 and over, adult: Secondary | ICD-10-CM

## 2020-02-28 MED ORDER — CITALOPRAM HYDROBROMIDE 40 MG PO TABS: 40.0000 mg | ORAL_TABLET | Freq: Every day | ORAL | 3 refills | Status: DC

## 2020-02-28 NOTE — Progress Notes (Signed)
Complete physical exam   Patient: Nainoa Woldt   DOB: 1981/02/02   39 y.o. Male  MRN: [id number] Visit Date: 02/28/2020  Today's healthcare provider: Mar Daring, PA-C   Chief Complaint  Patient presents with  . Annual Exam   Subjective    Ewald Beg is a 39 y.o. male who presents today for a complete physical exam.  He reports consuming a general diet. The patient does not participate in regular exercise at present. He generally feels well. He reports sleeping well. He does not have additional problems to discuss today.    Past Medical History:  Diagnosis Date  . Acute diverticulitis 01/19/2017  . Asthma    Childhood asthma  . Cancer ([place]) 2013   melanoma back left shoulder  . Diverticulitis 01/07/2017  . Enteritis due to Clostridium difficile    Past Surgical History:  Procedure Laterality Date  . COLONOSCOPY WITH PROPOFOL N/A 04/20/2017   Procedure: COLONOSCOPY WITH PROPOFOL;  Surgeon: Lin Landsman, MD;  Location: [facility] ENDOSCOPY;  Service: Gastroenterology;  Laterality: N/A;  . MELANOMA EXCISION  2013   Dr. Evorn Gong   Social History   Socioeconomic History  . Marital status: Married    Spouse name: Not on file  . Number of children: Not on file  . Years of education: Not on file  . Highest education level: Not on file  Occupational History  . Not on file  Tobacco Use  . Smoking status: Never Smoker  . Smokeless tobacco: Former Systems developer    Types: Snuff  Vaping Use  . Vaping Use: Never used  Substance and Sexual Activity  . Alcohol use: Yes  . Drug use: No  . Sexual activity: Yes  Other Topics Concern  . Not on file  Social History Narrative  . Not on file   Social Determinants of Health   Financial Resource Strain:   . Difficulty of Paying Living Expenses: Not on file  Food Insecurity:   . Worried About Charity fundraiser in the Last Year: Not on file  . Ran Out of Food in the Last Year: Not on file  Transportation Needs:   .  Lack of Transportation (Medical): Not on file  . Lack of Transportation (Non-Medical): Not on file  Physical Activity:   . Days of Exercise per Week: Not on file  . Minutes of Exercise per Session: Not on file  Stress:   . Feeling of Stress : Not on file  Social Connections:   . Frequency of Communication with Friends and Family: Not on file  . Frequency of Social Gatherings with Friends and Family: Not on file  . Attends Religious Services: Not on file  . Active Member of Clubs or Organizations: Not on file  . Attends Archivist Meetings: Not on file  . Marital Status: Not on file  Intimate Partner Violence:   . Fear of Current or Ex-Partner: Not on file  . Emotionally Abused: Not on file  . Physically Abused: Not on file  . Sexually Abused: Not on file   Family Status  Relation Name Status  . Father  Alive  . Mother  Alive  . Mat Uncle  (Not Specified)  . PGM  Deceased  . PGF  Deceased  . Sister  Alive  . Daughter  Alive  . Son  Alive  . MGM  Deceased  . MGF  Deceased   Family History  Problem Relation Age of Onset  .  Heart disease Father   . Hypertension Father   . Depression Mother   . Cancer Maternal Uncle   . Cancer Paternal Grandmother   . Skin cancer Paternal Grandfather   . Heart murmur Sister    No Known Allergies  Patient Care Team: Mar Daring, PA-C as PCP - General (Family Medicine)   Medications: Outpatient Medications Prior to Visit  Medication Sig  . acetaminophen (TYLENOL) 500 MG tablet Take 1,500 mg by mouth every 6 (six) hours as needed for moderate pain or headache.  . loratadine (CLARITIN) 10 MG tablet Take 10 mg by mouth daily.  . [DISCONTINUED] citalopram (CELEXA) 40 MG tablet Take 1 tablet (40 mg total) by mouth daily. Please schedule an office visit before anymore refills.   No facility-administered medications prior to visit.    Review of Systems  Constitutional: Negative.   HENT: Negative.   Eyes: Negative.     Respiratory: Negative.   Cardiovascular: Negative.   Gastrointestinal: Negative.   Endocrine: Negative.   Genitourinary: Negative.   Musculoskeletal: Negative.   Skin: Negative.   Allergic/Immunologic: Negative.   Neurological: Negative.   Hematological: Negative.   Psychiatric/Behavioral: Negative.       Objective    BP 140/80   Pulse 80   Temp 98.3 F (36.8 C)   Resp 16   Ht 6\' 3"  (1.905 m)   Wt (!) 346 lb (156.9 kg)   BMI 43.25 kg/m    Physical Exam Vitals reviewed.  Constitutional:      General: He is not in acute distress.    Appearance: Normal appearance. He is well-developed. He is obese. He is not ill-appearing.  HENT:     Head: Normocephalic and atraumatic.     Right Ear: Tympanic membrane, ear canal and external ear normal.     Left Ear: Tympanic membrane, ear canal and external ear normal.     Nose: Nose normal.     Mouth/Throat:     Mouth: Mucous membranes are moist.     Pharynx: Oropharynx is clear. No oropharyngeal exudate.  Eyes:     General: No scleral icterus.       Right eye: No discharge.        Left eye: No discharge.     Extraocular Movements: Extraocular movements intact.     Conjunctiva/sclera: Conjunctivae normal.     Pupils: Pupils are equal, round, and reactive to light.  Neck:     Thyroid: No thyromegaly.     Trachea: No tracheal deviation.  Cardiovascular:     Rate and Rhythm: Normal rate and regular rhythm.     Pulses: Normal pulses.     Heart sounds: Normal heart sounds. No murmur heard.   Pulmonary:     Effort: Pulmonary effort is normal. No respiratory distress.     Breath sounds: Normal breath sounds. No wheezing or rales.  Chest:     Chest wall: No tenderness.  Abdominal:     General: Abdomen is flat. Bowel sounds are normal. There is no distension.     Palpations: Abdomen is soft. There is no mass.     Tenderness: There is no abdominal tenderness. There is no guarding or rebound.  Musculoskeletal:        General: No  tenderness. Normal range of motion.     Cervical back: Normal range of motion and neck supple. No tenderness.     Right lower leg: No edema.     Left lower leg: No edema.  Lymphadenopathy:     Cervical: No cervical adenopathy.  Skin:    General: Skin is warm and dry.     Capillary Refill: Capillary refill takes less than 2 seconds.     Findings: No erythema or rash.  Neurological:     General: No focal deficit present.     Mental Status: He is alert and oriented to person, place, and time. Mental status is at baseline.     Cranial Nerves: No cranial nerve deficit.     Motor: No abnormal muscle tone.     Coordination: Coordination normal.     Deep Tendon Reflexes: Reflexes are normal and symmetric. Reflexes normal.  Psychiatric:        Mood and Affect: Mood normal.        Behavior: Behavior normal.        Thought Content: Thought content normal.        Judgment: Judgment normal.     Last depression screening scores PHQ 2/9 Scores 03/03/2020 10/29/2018 10/25/2017  PHQ - 2 Score 1 1 1   PHQ- 9 Score 1 1 5    Last fall risk screening Fall Risk  10/29/2018  Falls in the past year? 0   Last Audit-C alcohol use screening Alcohol Use Disorder Test (AUDIT) 10/29/2018  1. How often do you have a drink containing alcohol? 3  2. How many drinks containing alcohol do you have on a typical day when you are drinking? 1   A score of 3 or more in women, and 4 or more in men indicates increased risk for alcohol abuse, EXCEPT if all of the points are from question 1   No results found for any visits on 02/28/20.  Assessment & Plan    Routine Health Maintenance and Physical Exam  Exercise Activities and Dietary recommendations Goals   None     Immunization History  Administered Date(s) Administered  . Tdap 08/09/2017    Health Maintenance  Topic Date Due  . Hepatitis C Screening  Never done  . COVID-19 Vaccine (1) Never done  . INFLUENZA VACCINE  08/20/2020 (Originally 12/22/2019)  .  TETANUS/TDAP  08/10/2027  . HIV Screening  Completed    Discussed health benefits of physical activity, and encouraged him to engage in regular exercise appropriate for his age and condition.  1. Annual physical exam Normal physical exam today. Will check labs as below and f/u pending lab results. If labs are stable and WNL he will not need to have these rechecked for one year at his next annual physical exam. He is to call the office in the meantime if he has any acute issue, questions or concerns. - CBC w/Diff/Platelet - Comprehensive Metabolic Panel (CMET) - TSH - Lipid Panel With LDL/HDL Ratio - HgB A1c  2. Depression, major, single episode, mild (HCC) Stable. Diagnosis pulled for medication refill. Continue current medical treatment plan. - citalopram (CELEXA) 40 MG tablet; Take 1 tablet (40 mg total) by mouth daily.  Dispense: 90 tablet; Refill: 3  3. Class 3 severe obesity due to excess calories with serious comorbidity and body mass index (BMI) of 40.0 to 44.9 in adult [facility]) Doing so well. Has lost 50 pounds in last year. Continue healthy lifestyle modifications. Will check labs as below and f/u pending results. - CBC w/Diff/Platelet - Comprehensive Metabolic Panel (CMET) - TSH - Lipid Panel With LDL/HDL Ratio - HgB A1c  4. Encounter for hepatitis C screening test for low risk patient Will check labs as below and  f/u pending results. - Hepatitis C Antibody   Return in about 1 year (around 02/27/2021) for cpe.     Reynolds Bowl, PA-C, have reviewed all documentation for this visit. The documentation on 03/03/20 for the exam, diagnosis, procedures, and orders are all accurate and complete.   Rubye Beach  [facility] [phone] (phone) [phone] (fax)  [place]

## 2020-02-28 NOTE — Patient Instructions (Signed)

## 2020-03-14 LAB — COMPREHENSIVE METABOLIC PANEL
ALT: 36 IU/L (ref 0–44)
AST: 35 IU/L (ref 0–40)
Albumin/Globulin Ratio: 2.1 (ref 1.2–2.2)
Albumin: 4.7 g/dL (ref 4.0–5.0)
Alkaline Phosphatase: 65 IU/L (ref 44–121)
BUN/Creatinine Ratio: 17 (ref 9–20)
BUN: 15 mg/dL (ref 6–20)
Bilirubin Total: 0.6 mg/dL (ref 0.0–1.2)
CO2: 22 mmol/L (ref 20–29)
Calcium: 9.1 mg/dL (ref 8.7–10.2)
Chloride: 103 mmol/L (ref 96–106)
Creatinine, Ser: 0.89 mg/dL (ref 0.76–1.27)
GFR calc Af Amer: 125 mL/min/{1.73_m2} (ref >59)
GFR calc non Af Amer: 108 mL/min/{1.73_m2} (ref >59)
Globulin, Total: 2.2 g/dL (ref 1.5–4.5)
Glucose: 103 mg/dL — ABNORMAL HIGH (ref 65–99)
Potassium: 4 mmol/L (ref 3.5–5.2)
Sodium: 138 mmol/L (ref 134–144)
Total Protein: 6.9 g/dL (ref 6.0–8.5)

## 2020-03-14 LAB — CBC WITH DIFFERENTIAL/PLATELET
Basophils Absolute: 0 10*3/uL (ref 0.0–0.2)
Basos: 1 %
EOS (ABSOLUTE): 0.2 10*3/uL (ref 0.0–0.4)
Eos: 2 %
Hematocrit: 48.3 % (ref 37.5–51.0)
Hemoglobin: 16.4 g/dL (ref 13.0–17.7)
Immature Grans (Abs): 0 10*3/uL (ref 0.0–0.1)
Immature Granulocytes: 0 %
Lymphocytes Absolute: 1.2 10*3/uL (ref 0.7–3.1)
Lymphs: 18 %
MCH: 29.9 pg (ref 26.6–33.0)
MCHC: 34 g/dL (ref 31.5–35.7)
MCV: 88 fL (ref 79–97)
Monocytes Absolute: 0.3 10*3/uL (ref 0.1–0.9)
Monocytes: 4 %
Neutrophils Absolute: 5 10*3/uL (ref 1.4–7.0)
Neutrophils: 75 %
Platelets: 193 10*3/uL (ref 150–450)
RBC: 5.49 x10E6/uL (ref 4.14–5.80)
RDW: 13.1 % (ref 11.6–15.4)
WBC: 6.7 10*3/uL (ref 3.4–10.8)

## 2020-03-14 LAB — HEMOGLOBIN A1C
Est. average glucose Bld gHb Est-mCnc: 103 mg/dL
Hgb A1c MFr Bld: 5.2 % (ref 4.8–5.6)

## 2020-03-14 LAB — LIPID PANEL WITH LDL/HDL RATIO
Cholesterol, Total: 174 mg/dL (ref 100–199)
HDL: 34 mg/dL — ABNORMAL LOW (ref >39)
LDL Chol Calc (NIH): 123 mg/dL — ABNORMAL HIGH (ref 0–99)
LDL/HDL Ratio: 3.6 ratio (ref 0.0–3.6)
Triglycerides: 89 mg/dL (ref 0–149)
VLDL Cholesterol Cal: 17 mg/dL (ref 5–40)

## 2020-03-14 LAB — TSH: TSH: 1.57 u[IU]/mL (ref 0.450–4.500)

## 2020-03-14 LAB — HEPATITIS C ANTIBODY: Hep C Virus Ab: <0.1 s/co ratio (ref 0.0–0.9)

## 2020-08-13 ENCOUNTER — Ambulatory Visit
Admission: RE | Admit: 2020-08-13 | Discharge: 2020-08-13 | Disposition: A | Discharge status: Home / Self Care | Payer: 59 | Source: Ambulatory Visit | Attending: Physician Assistant | Admitting: Physician Assistant

## 2020-08-13 ENCOUNTER — Ambulatory Visit: Payer: 59 | Admitting: Physician Assistant

## 2020-08-13 ENCOUNTER — Other Ambulatory Visit: Payer: Self-pay

## 2020-08-13 VITALS — BP 130/76 | HR 76 | Temp 98.0°F | Ht 75.0 in | Wt 330.0 lb

## 2020-08-13 DIAGNOSIS — R1031 Right lower quadrant pain: Secondary | ICD-10-CM

## 2020-08-13 DIAGNOSIS — R1900 Intra-abdominal and pelvic swelling, mass and lump, unspecified site: Secondary | ICD-10-CM | POA: Diagnosis present

## 2020-08-13 DIAGNOSIS — R591 Generalized enlarged lymph nodes: Secondary | ICD-10-CM | POA: Diagnosis not present

## 2020-08-13 MED ORDER — DOXYCYCLINE HYCLATE 100 MG PO TABS: 100.0000 mg | ORAL_TABLET | Freq: Two times a day (BID) | ORAL | 0 refills | Status: DC

## 2020-08-13 NOTE — Progress Notes (Signed)
Established patient visit   Patient: Scott Aguilar   DOB: 1981-04-19   40 y.o. Male  MRN: [id number] Visit Date: 08/13/2020  Today's healthcare provider: Mar Daring, PA-C   No chief complaint on file.  Subjective    HPI  Patient presents with pelvic pain for 6 days. Pain is located around right anterior pelvis with a noticeable palpable knot. Patient describes the pain as dull at first and now more sharp. Patient denies injury. Pain is aggravated by leaning, sitting up in bed, picking up heavy objects. Patient rates pain as 4 or 5. Patient has not tried medications for the pain.    Patient Active Problem List   Diagnosis Date Noted  . Depression, major, single episode, mild ([place]) 08/09/2017  . History of Clostridium difficile colitis   . Acute diverticulitis 01/19/2017  . Diverticulitis 01/07/2017   Past Medical History:  Diagnosis Date  . Acute diverticulitis 01/19/2017  . Asthma    Childhood asthma  . Cancer ([place]) 2013   melanoma back left shoulder  . Diverticulitis 01/07/2017  . Enteritis due to Clostridium difficile        Medications: Outpatient Medications Prior to Visit  Medication Sig  . acetaminophen (TYLENOL) 500 MG tablet Take 1,500 mg by mouth every 6 (six) hours as needed for moderate pain or headache.  . citalopram (CELEXA) 40 MG tablet Take 1 tablet (40 mg total) by mouth daily.  Marland Kitchen loratadine (CLARITIN) 10 MG tablet Take 10 mg by mouth daily.   No facility-administered medications prior to visit.    Review of Systems  Constitutional: Negative.   Respiratory: Negative.   Cardiovascular: Negative.   Hematological: Positive for adenopathy.    Last CBC Lab Results  Component Value Date   WBC 6.7 03/13/2020   HGB 16.4 03/13/2020   HCT 48.3 03/13/2020   MCV 88 03/13/2020   MCH 29.9 03/13/2020   RDW 13.1 03/13/2020   PLT 193 16/38/4665   Last metabolic panel Lab Results  Component Value Date   GLUCOSE 103 (H) 03/13/2020   NA  138 03/13/2020   K 4.0 03/13/2020   CL 103 03/13/2020   CO2 22 03/13/2020   BUN 15 03/13/2020   CREATININE 0.89 03/13/2020   GFRNONAA 108 03/13/2020   GFRAA 125 03/13/2020   CALCIUM 9.1 03/13/2020   PROT 6.9 03/13/2020   ALBUMIN 4.7 03/13/2020   LABGLOB 2.2 03/13/2020   AGRATIO 2.1 03/13/2020   BILITOT 0.6 03/13/2020   ALKPHOS 65 03/13/2020   AST 35 03/13/2020   ALT 36 03/13/2020   ANIONGAP 9 02/06/2017       Objective    There were no vitals taken for this visit. BP Readings from Last 3 Encounters:  08/13/20 130/76  02/28/20 140/80  10/29/18 124/80   Wt Readings from Last 3 Encounters:  08/13/20 (!) 330 lb (149.7 kg)  02/28/20 (!) 346 lb (156.9 kg)  10/29/18 (!) 393 lb 8 oz (178.5 kg)       Physical Exam Vitals reviewed.  Constitutional:      General: He is not in acute distress.    Appearance: Normal appearance. He is well-developed. He is obese. He is not ill-appearing or diaphoretic.  HENT:     Head: Normocephalic and atraumatic.  Cardiovascular:     Rate and Rhythm: Normal rate and regular rhythm.     Heart sounds: Normal heart sounds. No murmur heard. No friction rub. No gallop.   Pulmonary:  Effort: Pulmonary effort is normal. No respiratory distress.     Breath sounds: Normal breath sounds. No wheezing or rales.  Abdominal:     General: Abdomen is flat. Bowel sounds are normal.     Palpations: Abdomen is soft. There is mass (right inguinal area).  Musculoskeletal:     Cervical back: Normal range of motion and neck supple.  Neurological:     Mental Status: He is alert.       No results found for any visits on 08/13/20.  Assessment & Plan     1. Abdominal pain, RLQ Will get [place] for further evaluation. Suspect reactive lymph node. Will give trial of antibiotic as below to see if resolves. Also discussed warm compresses. Call if not improving.  - US Pelvis Limited; Future - doxycycline (VIBRA-TABS) 100 MG tablet; Take 1 tablet (100 mg total) by  mouth 2 (two) times daily.  Dispense: 20 tablet; Refill: 0  2. Mass of soft tissue of abdomen See above medical treatment plan. - US Pelvis Limited; Future - doxycycline (VIBRA-TABS) 100 MG tablet; Take 1 tablet (100 mg total) by mouth 2 (two) times daily.  Dispense: 20 tablet; Refill: 0  3. Lymphadenopathy See above medical treatment plan. - doxycycline (VIBRA-TABS) 100 MG tablet; Take 1 tablet (100 mg total) by mouth 2 (two) times daily.  Dispense: 20 tablet; Refill: 0   No follow-ups on file.      Reynolds Bowl, PA-C, have reviewed all documentation for this visit. The documentation on 08/28/20 for the exam, diagnosis, procedures, and orders are all accurate and complete.   Rubye Beach  [facility] [phone] (phone) [phone] (fax)  [place]

## 2020-08-28 ENCOUNTER — Encounter: Payer: Self-pay | Admitting: Physician Assistant

## 2021-01-04 ENCOUNTER — Ambulatory Visit: Payer: Self-pay | Admitting: *Deleted

## 2021-01-04 NOTE — Telephone Encounter (Signed)
Left VM to CB for additional triage regarding symptoms.

## 2021-01-04 NOTE — Telephone Encounter (Signed)
Left VM for patient with appointment date and time. Spoke with patient earlier today when he stated he was not having any symptoms today, No CP, no palpitations, no lightheadedness, no  left arm numbness any longer.

## 2021-01-04 NOTE — Telephone Encounter (Signed)
Appt made with Dr. Rosanna Randy for 01/05/2021 at 2:20pm

## 2021-01-04 NOTE — Telephone Encounter (Signed)
Lmtcb when patient returns call please have patient scheduled with Dr. Rosanna Randy either 8/16 or 8/17 as that is the soonest available appt time in our office. Please have triage nurse speak with patient to make sure he is not experiencing symptoms of chest pain? Palpitations? Or chest pressure. KW

## 2021-01-04 NOTE — Telephone Encounter (Signed)
Patient experienced Left arm numbness from fingertips to shoulder 3 days ago, Friday. Felt woozy at the initial start of episode along with a dull ache in the arm also. Lightheadedness and pain resolved quickly but the numbness symptom within the episode lasted about 2 hours. Unable to say if sweating occurred as he was sweaty prior to episode. No vision changes/slurred speech/one-sided weakness/SOB/fever.No vital signs available at the time. Woke up Saturday morning feeling sluggish for a short period but no other symptoms. Reviewed urgent symptoms requiring immediate evaluation-Patient stated he understood. Routing to office for appointment within 3 days per protocol.     Answer Assessment - Initial Assessment Questions 1. SYMPTOM: "What is the main symptom you are concerned about?" (e.g., weakness, numbness)     Numbness in left arm 3 days ago. 2. ONSET: "When did this start?" (minutes, hours, days; while sleeping)     Friday evening 3. LAST NORMAL: "When was the last time you (the patient) were normal (no symptoms)?"     Thursday prior to event. Also, he has felt normal since mid-day Saturday. 4. PATTERN "Does this come and go, or has it been constant since it started?"  "Is it present now?"     One episode. Is not present now. 5. CARDIAC SYMPTOMS: "Have you had any of the following symptoms: chest pain, difficulty breathing, palpitations?"     At the time on Saturday he felt an irregular heart rate lasting about 15-20 minutes 6. NEUROLOGIC SYMPTOMS: "Have you had any of the following symptoms: headache, dizziness, vision loss, double vision, changes in speech, unsteady on your feet?"     Lightheaded-resolved within minutes 7. OTHER SYMPTOMS: "Do you have any other symptoms?"     No 8. PREGNANCY: "Is there any chance you are pregnant?" "When was your last menstrual period?"     na  Protocols used: Neurologic Deficit-A-AH

## 2021-01-05 ENCOUNTER — Other Ambulatory Visit: Payer: Self-pay

## 2021-01-05 ENCOUNTER — Ambulatory Visit: Payer: 59 | Admitting: Family Medicine

## 2021-01-05 ENCOUNTER — Encounter: Payer: Self-pay | Admitting: Family Medicine

## 2021-01-05 VITALS — BP 124/83 | HR 64 | Temp 98.7°F | Resp 16 | Ht 75.0 in | Wt 331.0 lb

## 2021-01-05 DIAGNOSIS — F320 Major depressive disorder, single episode, mild: Secondary | ICD-10-CM

## 2021-01-05 DIAGNOSIS — M79622 Pain in left upper arm: Secondary | ICD-10-CM | POA: Diagnosis not present

## 2021-01-05 DIAGNOSIS — R42 Dizziness and giddiness: Secondary | ICD-10-CM

## 2021-01-05 DIAGNOSIS — Z8249 Family history of ischemic heart disease and other diseases of the circulatory system: Secondary | ICD-10-CM

## 2021-01-05 DIAGNOSIS — Z6841 Body Mass Index (BMI) 40.0 and over, adult: Secondary | ICD-10-CM

## 2021-01-05 NOTE — Progress Notes (Signed)
I,Scott Aguilar,acting as a scribe for Scott Durie, MD.,have documented all relevant documentation on the behalf of Scott Durie, MD,as directed by  Scott Durie, MD while in the presence of Scott Durie, MD.   Established patient visit   Patient: Scott Aguilar   DOB: May 29, 1980   40 y.o. Male  MRN: [id number] Visit Date: 01/05/2021  Today's healthcare provider: [name], MD   Chief Complaint  Patient presents with   Dizziness    arm   Numbness   Subjective  -------------------------------------------------------------------------------------------------------------  HPI HPI     Dizziness    Additional comments: arm      Last edited by Julieta Bellini, CMA on 01/05/2021  2:20 PM.      Patient is here concerning an episode he had 4 days ago. 4 days ago patient felt woozy and had a dull ache in left arm. Patient also has lightheadedness. Pain in his arm resolved quickly. However patient states his left arm became numb. Numbness lasted about 2 hours. Patient states that it did feel like his heart was racing. No chest pain or tightness. No vision changes/slurred speech/one-sided weakness/SOB/fever. Patient woke up Saturday morning feeling sluggish for a short period but no other symptoms.  Patient 4 days ago had left arm pain and dizziness which lasted about 30 minutes.  He had no associated symptoms of chest pain diaphoresis shortness of breath PND orthopnea. He is worried about heart disease as his father developed heart disease in his early 52s.  He is a rare smoker who is lost over 90 pounds from his highest weight of 425 pounds.  He has done this intentionally.     Medications: Outpatient Medications Prior to Visit  Medication Sig   acetaminophen (TYLENOL) 500 MG tablet Take 1,500 mg by mouth every 6 (six) hours as needed for moderate pain or headache.   citalopram (CELEXA) 40 MG tablet Take 1 tablet (40 mg total) by mouth daily.    loratadine (CLARITIN) 10 MG tablet Take 10 mg by mouth daily.   [DISCONTINUED] doxycycline (VIBRA-TABS) 100 MG tablet Take 1 tablet (100 mg total) by mouth 2 (two) times daily.   No facility-administered medications prior to visit.    Review of Systems  Constitutional:  Negative for appetite change, chills and fever.  Respiratory:  Negative for chest tightness, shortness of breath and wheezing.   Cardiovascular:  Negative for chest pain and palpitations.  Gastrointestinal:  Negative for abdominal pain, nausea and vomiting.  Neurological:  Positive for light-headedness and numbness.       Objective  -------------------------------------------------------------------------------------------------------------------- BP 124/83 (BP Location: Left Arm, Patient Position: Sitting, Cuff Size: Large)   Pulse 64   Temp 98.7 F (37.1 C) (Oral)   Resp 16   Ht '6\' 3"'$  (1.905 m)   Wt (!) 331 lb (150.1 kg)   SpO2 97%   BMI 41.37 kg/m  BP Readings from Last 3 Encounters:  01/05/21 124/83  08/13/20 130/76  02/28/20 140/80   Wt Readings from Last 3 Encounters:  01/05/21 (!) 331 lb (150.1 kg)  08/13/20 (!) 330 lb (149.7 kg)  02/28/20 (!) 346 lb (156.9 kg)       Physical Exam Vitals reviewed.  Constitutional:      Appearance: He is well-developed. He is obese.     Comments: obese  HENT:     Head: Normocephalic.     Right Ear: Hearing, tympanic membrane, ear canal and external ear normal.  Left Ear: Hearing, tympanic membrane, ear canal and external ear normal.     Nose: Nose normal.     Mouth/Throat:     Pharynx: Uvula midline.  Eyes:     Conjunctiva/sclera: Conjunctivae normal.  Cardiovascular:     Rate and Rhythm: Normal rate and regular rhythm.     Heart sounds: Normal heart sounds.  Pulmonary:     Effort: Pulmonary effort is normal.     Breath sounds: Normal breath sounds.  Abdominal:     General: Bowel sounds are normal.     Palpations: Abdomen is soft.  Musculoskeletal:      Cervical back: Normal range of motion.  Skin:    General: Skin is warm and dry.     Comments: Fair skin.  Red hair  Neurological:     Mental Status: He is alert and oriented to person, place, and time.  Psychiatric:        Mood and Affect: Mood normal.        Behavior: Behavior normal.        Thought Content: Thought content normal.        Judgment: Judgment normal.    ECG reveals normal sinus rhythm rate of about 64 with no ischemic changes.  No results found for any visits on 01/05/21.  Assessment & Plan  ----------------------------------------------------------------------------------------------------------------------  1. Lightheadedness Refer to cardiology for work-up and peace of mind for patient with family history and risk factors including his size and smoking.  I have him start aspirin 81 mg daily till he sees cardiology. - EKG 12-Lead  2. Left  arm pain  - Ambulatory referral to Cardiology  3. Dizziness  - Ambulatory referral to Cardiology  4. Family history of early CAD  - Ambulatory referral to Cardiology 5.  Obesity Patient working on diet and exercise   Return in about 6 months (around 07/08/2021).      I, Scott Durie, MD, have reviewed all documentation for this visit. The documentation on 01/11/21 for the exam, diagnosis, procedures, and orders are all accurate and complete.    Ules Marsala Cranford Mon, MD  [facility] [phone] (phone) [phone] (fax)  [place]

## 2021-01-05 NOTE — Patient Instructions (Signed)
Take Aspirin 81 mg daily.

## 2021-01-05 NOTE — Telephone Encounter (Signed)
Pt returned call. Aware of appt today. Denies any chest pain, no chest pressure or palpitations.

## 2021-01-06 ENCOUNTER — Ambulatory Visit: Payer: 59 | Admitting: Family Medicine

## 2021-02-19 ENCOUNTER — Ambulatory Visit: Payer: 59 | Admitting: Cardiovascular Disease

## 2021-07-08 ENCOUNTER — Other Ambulatory Visit: Payer: Self-pay

## 2021-07-08 ENCOUNTER — Encounter: Payer: Self-pay | Admitting: Family Medicine

## 2021-07-08 ENCOUNTER — Ambulatory Visit (INDEPENDENT_AMBULATORY_CARE_PROVIDER_SITE_OTHER): Payer: 59 | Admitting: Family Medicine

## 2021-07-08 VITALS — BP 144/98 | HR 73 | Ht 75.0 in | Wt 338.0 lb

## 2021-07-08 DIAGNOSIS — F418 Other specified anxiety disorders: Secondary | ICD-10-CM

## 2021-07-08 MED ORDER — CITALOPRAM HYDROBROMIDE 20 MG PO TABS: 20.0000 mg | ORAL_TABLET | Freq: Every day | ORAL | 0 refills | Status: DC

## 2021-07-08 NOTE — Progress Notes (Signed)
°  ° °  Primary Care / Sports Medicine Office Visit  Patient Information:  Patient ID: Scott Aguilar, male DOB: Apr 14, 1981 Age: 41 y.o. MRN: [id number]   Scott Aguilar is a pleasant 41 y.o. male presenting with the following:  Chief Complaint  Patient presents with   New Patient (Initial Visit)   Establish Care   Depression   Anxiety   Allergic Rhinitis     Vitals:   07/08/21 1320  BP: (!) 144/98  Pulse: 73  SpO2: 98%   Vitals:   07/08/21 1320  Weight: (!) 338 lb (153.3 kg)  Height: 6\' 3"  (1.905 m)   Body mass index is 42.25 kg/m.  No results found.   Independent interpretation of notes and tests performed by another provider:   None  Procedures performed:   None  Pertinent History, Exam, Impression, and Recommendations:   Depression with anxiety Patient with longstanding history of depression/anxiety, ongoing for years.  Previously relatively well controlled with citalopram 40 mg daily.  He has been off of this dose for over a year citing barriers to follow-up with medical care.  He denies any ideations.  He has noted worsening symptoms over the past few months that have coincided with new stressful job.  We spent a length of time discussing both pharmacologic and nonpharmacologic treatment strategies, utility of restarting citalopram versus alternative agent.  We reviewed the adverse effect profile of this medication and he did not experience any significant longstanding issues, did note improvement in weight and sexual function while on this medication.  Given these factors I have advised him to restart citalopram, this will be at the 20 mg dose until his return in 6 weeks or we can determine the need for further titration.  Additionally, I have provided information that is patient oriented for nonpharmacologic methods to aid in depression/anxiety.  Chronic condition, uncontrolled, Rx management   Orders & Medications Meds ordered this encounter  Medications    citalopram (CELEXA) 20 MG tablet    Sig: Take 1 tablet (20 mg total) by mouth daily.    Dispense:  45 tablet    Refill:  0   No orders of the defined types were placed in this encounter.    Return in about 6 weeks (around 08/19/2021) for Annual physical.     Montel Culver, MD   [place]

## 2021-07-08 NOTE — Assessment & Plan Note (Signed)
Patient with longstanding history of depression/anxiety, ongoing for years.  Previously relatively well controlled with citalopram 40 mg daily.  He has been off of this dose for over a year citing barriers to follow-up with medical care.  He denies any ideations.  He has noted worsening symptoms over the past few months that have coincided with new stressful job.  We spent a length of time discussing both pharmacologic and nonpharmacologic treatment strategies, utility of restarting citalopram versus alternative agent.  We reviewed the adverse effect profile of this medication and he did not experience any significant longstanding issues, did note improvement in weight and sexual function while on this medication.  Given these factors I have advised him to restart citalopram, this will be at the 20 mg dose until his return in 6 weeks or we can determine the need for further titration.  Additionally, I have provided information that is patient oriented for nonpharmacologic methods to aid in depression/anxiety.  Chronic condition, uncontrolled, Rx management

## 2021-07-08 NOTE — Patient Instructions (Signed)
-   Start Celexa 20 mg daily - Review information regarding methods to further improve your symptoms - Return for follow-up in 6 weeks, contact us for any questions between now and then

## 2021-07-19 ENCOUNTER — Encounter: Payer: 59 | Admitting: Family Medicine

## 2021-08-02 ENCOUNTER — Other Ambulatory Visit: Payer: Self-pay | Admitting: Family Medicine

## 2021-08-02 DIAGNOSIS — F418 Other specified anxiety disorders: Secondary | ICD-10-CM

## 2021-08-02 NOTE — Telephone Encounter (Signed)
Requested medications are due for refill today.  yes ? ?Requested medications are on the active medications list.  yes ? ?Last refill. 07/08/2021 #45 0 refills ? ?Future visit scheduled.   yes ? ?Notes to clinic.  Pharmacy needs Dx code. ? ? ? ?Requested Prescriptions  ?Pending Prescriptions Disp Refills  ? citalopram (CELEXA) 20 MG tablet [Pharmacy Med Name: CITALOPRAM HBR 20 MG TABLET] 90 tablet 1  ?  Sig: TAKE 1 TABLET BY MOUTH EVERY DAY  ?  ? Psychiatry:  Antidepressants - SSRI Passed - 08/02/2021 10:31 AM  ?  ?  Passed - Completed PHQ-2 or PHQ-9 in the last 360 days  ?  ?  Passed - Valid encounter within last 6 months  ?  Recent Outpatient Visits   ? ?      ? 3 weeks ago Depression with anxiety  ? [facility] Montel Culver, MD  ? 6 months ago Lightheadedness  ? [facility] Jerrol Banana., MD  ? 11 months ago Abdominal pain, RLQ  ? [facility] [place], [place] Sorrel, [place]  ? 1 year ago Annual physical exam  ? [facility] [place], [place], [place]  ? 2 years ago Annual physical exam  ? [facility] [place] F[place] Malling M, [place]  ? ?  ?  ?Future Appointments   ? ?        ? In 2 weeks Zigmund Daniel Earley Abide, MD [facility] [place]  ? ?  ? ?  ?  ?  ?  ?

## 2021-08-19 ENCOUNTER — Encounter: Payer: Self-pay | Admitting: Family Medicine

## 2021-08-19 ENCOUNTER — Ambulatory Visit (INDEPENDENT_AMBULATORY_CARE_PROVIDER_SITE_OTHER): Payer: 59 | Admitting: Family Medicine

## 2021-08-19 VITALS — BP 124/76 | HR 76 | Ht 75.0 in | Wt 339.0 lb

## 2021-08-19 DIAGNOSIS — R7989 Other specified abnormal findings of blood chemistry: Secondary | ICD-10-CM

## 2021-08-19 DIAGNOSIS — Z13220 Encounter for screening for lipoid disorders: Secondary | ICD-10-CM

## 2021-08-19 DIAGNOSIS — Z1159 Encounter for screening for other viral diseases: Secondary | ICD-10-CM | POA: Diagnosis not present

## 2021-08-19 DIAGNOSIS — Z114 Encounter for screening for human immunodeficiency virus [HIV]: Secondary | ICD-10-CM | POA: Diagnosis not present

## 2021-08-19 DIAGNOSIS — E66813 Obesity, class 3: Secondary | ICD-10-CM

## 2021-08-19 DIAGNOSIS — Z0000 Encounter for general adult medical examination without abnormal findings: Secondary | ICD-10-CM

## 2021-08-19 DIAGNOSIS — F418 Other specified anxiety disorders: Secondary | ICD-10-CM

## 2021-08-19 MED ORDER — CITALOPRAM HYDROBROMIDE 40 MG PO TABS: 40.0000 mg | ORAL_TABLET | Freq: Every day | ORAL | 0 refills | Status: DC

## 2021-08-19 NOTE — Assessment & Plan Note (Signed)
Annual examination completed, risk stratification labs ordered, anticipatory guidance provided.  We will follow labs once resulted. 

## 2021-08-19 NOTE — Assessment & Plan Note (Signed)
Patient has demonstrated excellent interval response with Celexa 20 mg, this is noted on PHQ and GAD scores.  That being said, we had a discussion about maintaining this dose versus further titration.  He is requesting further titration citing persistent symptomatology despite significant improvement.  Plan for Celexa 40 mg daily, 15-monthvirtual follow-up to assess response.  We can determine the need for continued 40 mg dose versus titration back to 20 mg versus adjunct treatments. ?

## 2021-08-19 NOTE — Progress Notes (Signed)
?  ? ?Annual Physical Exam Visit ? ?Patient Information:  ?Patient ID: Scott Aguilar, male DOB: Dec 22, 1980 Age: 41 y.o. MRN: [id number]  ? ?Subjective:  ? ?CC: Annual Physical Exam ? ?HPI:  ?Scott Aguilar is here for their annual physical. ? ?I reviewed the past medical history, family history, social history, surgical history, and allergies today and changes were made as necessary.  Please see the problem list section below for additional details. ? ?Past Medical History: ?Past Medical History:  ?Diagnosis Date  ? Acute diverticulitis 01/19/2017  ? Allergy   ? Anxiety   ? Asthma   ? Childhood asthma  ? Cancer [facility]) 2013  ? melanoma back left shoulder  ? Depression   ? Diverticulitis 01/07/2017  ? Enteritis due to Clostridium difficile   ? ?Past Surgical History: ?Past Surgical History:  ?Procedure Laterality Date  ? COLONOSCOPY WITH PROPOFOL N/A 04/20/2017  ? Procedure: COLONOSCOPY WITH PROPOFOL;  Surgeon: Lin Landsman, MD;  Location: [facility] ENDOSCOPY;  Service: Gastroenterology;  Laterality: N/A;  ? MELANOMA EXCISION  2013  ? Dr. Evorn Gong  ? WISDOM TOOTH EXTRACTION    ? ?Family History: ?Family History  ?Problem Relation Age of Onset  ? Heart disease Mother   ? Depression Mother   ? Heart disease Father   ? Hypertension Father   ? Heart murmur Sister   ? Cancer Maternal Uncle   ? Cancer Paternal Grandmother   ? Skin cancer Paternal Grandfather   ? ?Allergies: ?No Known Allergies ?Health Maintenance: ?Health Maintenance  ?Topic Date Due  ? INFLUENZA VACCINE  08/20/2021 (Originally 12/21/2020)  ? COVID-19 Vaccine (1) 01/21/2022 (Originally 12/20/1980)  ? TETANUS/TDAP  08/10/2027  ? Hepatitis C Screening  Completed  ? HIV Screening  Completed  ? HPV VACCINES  Aged Out  ?  ?HM Colonoscopy   ? ? This patient has no relevant Health Maintenance data.  ? ?  ? ?Medications: ?Current Outpatient Medications on File Prior to Visit  ?Medication Sig Dispense Refill  ? loratadine (CLARITIN) 10 MG tablet Take 10 mg by mouth  daily.    ? ?No current facility-administered medications on file prior to visit.  ? ? ?Review of Systems: No headache, visual changes, nausea, vomiting, diarrhea, constipation, dizziness, abdominal pain, skin rash, fevers, chills, night sweats, swollen lymph nodes, weight loss, chest pain, body aches, joint swelling, muscle aches, shortness of breath, mood changes, visual or auditory hallucinations reported. ? ?Objective:  ? ?Vitals:  ? 08/19/21 0802  ?BP: 124/76  ?Pulse: 76  ?SpO2: 98%  ? ?Vitals:  ? 08/19/21 0802  ?Weight: (!) 339 lb (153.8 kg)  ?Height: '6\' 3"'$  (1.905 m)  ? ?Body mass index is 42.37 kg/m?. ? ?General: Well Developed, well nourished, and in no acute distress.  ?Neuro: Alert and oriented x3, extra-ocular muscles intact, sensation grossly intact. Cranial nerves II through XII are grossly intact, motor, sensory, and coordinative functions are intact. ?HEENT: Normocephalic, atraumatic, pupils equal round reactive to light, neck supple, no masses, no lymphadenopathy, thyroid nonpalpable. Oropharynx, nasopharynx, external ear canals are unremarkable.  Left tympanic membrane with 2 o'clock position scarring. ?Skin: Warm and dry, no rashes noted.  Well-healed surgical scar at the left upper back consistent with surgical history.  Multiple actinic keratoses on the back. ?Cardiac: Regular rate and rhythm, no murmurs rubs or gallops. No peripheral edema. Pulses symmetric. ?Respiratory: Clear to auscultation bilaterally. Not using accessory muscles, speaking in full sentences.  ?Abdominal: Soft, nontender, nondistended, positive bowel sounds, no masses, no  organomegaly. ?Musculoskeletal: Shoulder, elbow, wrist, hip, knee, ankle stable, and with full range of motion. ? ?Impression and Recommendations:  ? ?The patient was counselled, risk factors were discussed, and anticipatory guidance given. ? ?Obesity, Class III, BMI 40-49.9 (morbid obesity) ([place]) ?Healthy dietary/exercise information provided, patient has  had independent significant weight loss in the past, I have encouraged him to pursue these methods further.  Additionally, discussed possible adjunct Wegovy, he will check on coverage.  Risk stratification labs ordered as well. ? ?Depression with anxiety ?Patient has demonstrated excellent interval response with Celexa 20 mg, this is noted on PHQ and GAD scores.  That being said, we had a discussion about maintaining this dose versus further titration.  He is requesting further titration citing persistent symptomatology despite significant improvement.  Plan for Celexa 40 mg daily, 65-monthvirtual follow-up to assess response.  We can determine the need for continued 40 mg dose versus titration back to 20 mg versus adjunct treatments. ? ?Annual physical exam ?Annual examination completed, risk stratification labs ordered, anticipatory guidance provided.  We will follow labs once resulted. ? ?Orders & Medications ?Medications:  ?Meds ordered this encounter  ?Medications  ? citalopram (CELEXA) 40 MG tablet  ?  Sig: Take 1 tablet (40 mg total) by mouth daily.  ?  Dispense:  90 tablet  ?  Refill:  0  ? ?Orders Placed This Encounter  ?Procedures  ? Apo A1 + B + Ratio  ? CBC  ? Comprehensive metabolic panel  ? Lipid panel  ? TSH  ? VITAMIN D 25 Hydroxy (Vit-D Deficiency, Fractures)  ? HIV Antibody (routine testing w rflx)  ? Hepatitis C antibody  ?  ? ?Return in about 2 months (around 10/19/2021) for MyChart video visit.  ? ? ?JMontel Culver MD ? ? Primary Care Sports Medicine ?[facility] ? ?

## 2021-08-19 NOTE — Assessment & Plan Note (Signed)
Healthy dietary/exercise information provided, patient has had independent significant weight loss in the past, I have encouraged him to pursue these methods further.  Additionally, discussed possible adjunct Wegovy, he will check on coverage.  Risk stratification labs ordered as well. ?

## 2021-08-19 NOTE — Patient Instructions (Addendum)
-   Obtain fasting labs with orders provided (can have water or black coffee but otherwise no food or drink x 8 hours before labs) ?- Take new dose of Celexa 40 mg ?- Can review www.Wegovy.com for coverage information ?- Establish back with your dermatologist for skin check ?- Review information provided ?- Attend eye doctor annually, dentist every 6 months, work towards or maintain 30 minutes of moderate intensity physical activity at least 5 days per week, and consume a balanced diet ?- Return in 2 months for virtual MyChart visit ?- Contact us for any questions between now and then ?

## 2021-08-20 ENCOUNTER — Other Ambulatory Visit: Payer: Self-pay | Admitting: Family Medicine

## 2021-08-20 DIAGNOSIS — R7989 Other specified abnormal findings of blood chemistry: Secondary | ICD-10-CM

## 2021-08-20 LAB — CBC
Hematocrit: 50 % (ref 37.5–51.0)
Hemoglobin: 17.1 g/dL (ref 13.0–17.7)
MCH: 28.8 pg (ref 26.6–33.0)
MCHC: 34.2 g/dL (ref 31.5–35.7)
MCV: 84 fL (ref 79–97)
Platelets: 203 10*3/uL (ref 150–450)
RBC: 5.94 x10E6/uL — ABNORMAL HIGH (ref 4.14–5.80)
RDW: 13.9 % (ref 11.6–15.4)
WBC: 4.5 10*3/uL (ref 3.4–10.8)

## 2021-08-20 LAB — COMPREHENSIVE METABOLIC PANEL
ALT: 31 IU/L (ref 0–44)
AST: 32 IU/L (ref 0–40)
Albumin/Globulin Ratio: 2.1 (ref 1.2–2.2)
Albumin: 4.7 g/dL (ref 4.0–5.0)
Alkaline Phosphatase: 60 IU/L (ref 44–121)
BUN/Creatinine Ratio: 19 (ref 9–20)
BUN: 19 mg/dL (ref 6–24)
Bilirubin Total: 0.6 mg/dL (ref 0.0–1.2)
CO2: 22 mmol/L (ref 20–29)
Calcium: 9.7 mg/dL (ref 8.7–10.2)
Chloride: 103 mmol/L (ref 96–106)
Creatinine, Ser: 1 mg/dL (ref 0.76–1.27)
Globulin, Total: 2.2 g/dL (ref 1.5–4.5)
Glucose: 92 mg/dL (ref 70–99)
Potassium: 4.6 mmol/L (ref 3.5–5.2)
Sodium: 140 mmol/L (ref 134–144)
Total Protein: 6.9 g/dL (ref 6.0–8.5)
eGFR: 98 mL/min/{1.73_m2} (ref >59)

## 2021-08-20 LAB — APO A1 + B + RATIO
Apolipo. B/A-1 Ratio: 0.8 ratio — ABNORMAL HIGH (ref 0.0–0.7)
Apolipoprotein A-1: 120 mg/dL (ref 101–178)
Apolipoprotein B: 93 mg/dL — ABNORMAL HIGH (ref <90)

## 2021-08-20 LAB — LIPID PANEL
Chol/HDL Ratio: 4.2 ratio (ref 0.0–5.0)
Cholesterol, Total: 165 mg/dL (ref 100–199)
HDL: 39 mg/dL — ABNORMAL LOW (ref >39)
LDL Chol Calc (NIH): 111 mg/dL — ABNORMAL HIGH (ref 0–99)
Triglycerides: 78 mg/dL (ref 0–149)
VLDL Cholesterol Cal: 15 mg/dL (ref 5–40)

## 2021-08-20 LAB — HIV ANTIBODY (ROUTINE TESTING W REFLEX): HIV Screen 4th Generation wRfx: NONREACTIVE

## 2021-08-20 LAB — HEPATITIS C ANTIBODY: Hep C Virus Ab: NONREACTIVE

## 2021-08-20 LAB — VITAMIN D 25 HYDROXY (VIT D DEFICIENCY, FRACTURES): Vit D, 25-Hydroxy: 22.9 ng/mL — ABNORMAL LOW (ref 30.0–100.0)

## 2021-08-20 LAB — TSH: TSH: 2.25 u[IU]/mL (ref 0.450–4.500)

## 2021-08-20 MED ORDER — VITAMIN D (ERGOCALCIFEROL) 1.25 MG (50000 UNIT) PO CAPS
50000.0000 [IU] | ORAL_CAPSULE | ORAL | 0 refills | Status: DC
Start: 2021-08-20 — End: 2021-10-19

## 2021-09-13 ENCOUNTER — Other Ambulatory Visit: Payer: Self-pay | Admitting: Family Medicine

## 2021-09-13 DIAGNOSIS — R7989 Other specified abnormal findings of blood chemistry: Secondary | ICD-10-CM

## 2021-09-14 NOTE — Telephone Encounter (Signed)
Requested medication (s) are due for refill today: no ? ?Requested medication (s) are on the active medication list: yes ? ?Last refill:  08/20/21 #8 0 refills ? ?Future visit scheduled: yes 1 month ? ?Notes to clinic: 8 weeks ordered. Manual review per protocol. Do you want to refill/ renew Rx? ? ? ?  ?Requested Prescriptions  ?Pending Prescriptions Disp Refills  ? Vitamin D, Ergocalciferol, (DRISDOL) 1.25 MG (50000 UNIT) CAPS capsule [Pharmacy Med Name: VITAMIN D2 1.'25MG'$ (50,000 UNIT)] 4 capsule 1  ?  Sig: Take 1 capsule (50,000 Units total) by mouth every 7 (seven) days. Take for 8 total doses(weeks)  ?  ? Endocrinology:  Vitamins - Vitamin D Supplementation 2 Failed - 09/13/2021 10:30 AM  ?  ?  Failed - Manual Review: Route requests for 50,000 IU strength to the provider  ?  ?  Failed - Vitamin D in normal range and within 360 days  ?  Vit D, 25-Hydroxy  ?Date Value Ref Range Status  ?08/19/2021 22.9 (L) 30.0 - 100.0 ng/mL Final  ?  Comment:  ?  Vitamin D deficiency has been defined by the Institute of ?Medicine and an Endocrine Society practice guideline as a ?level of serum 25-OH vitamin D less than 20 ng/mL (1,2). ?The Endocrine Society went on to further define vitamin D ?insufficiency as a level between 21 and 29 ng/mL (2). ?1. IOM (Institute of Medicine). 2010. Dietary reference ?   intakes for calcium and D. [place]: The ?   [employer]. ?2. Holick MF, Binkley [place], Bischoff-Ferrari HA, et al. ?   Evaluation, treatment, and prevention of vitamin D ?   deficiency: an Endocrine Society clinical practice ?   guideline. JCEM. 2011 Jul; 96(7):1911-30. ?  ?  ?  ?  ?  Passed - Ca in normal range and within 360 days  ?  Calcium  ?Date Value Ref Range Status  ?08/19/2021 9.7 8.7 - 10.2 mg/dL Final  ?  ?  ?  ?  Passed - Valid encounter within last 12 months  ?  Recent Outpatient Visits   ? ?      ? 3 weeks ago Annual physical exam  ? [facility] Montel Culver, MD  ? 2 months ago  Depression with anxiety  ? [facility] Montel Culver, MD  ? 8 months ago Lightheadedness  ? [facility] Jerrol Banana., MD  ? 1 year ago Abdominal pain, RLQ  ? [facility] [place], [place], [place]  ? 1 year ago Annual physical exam  ? [facility] [place] Malling [place], [place]  ? ?  ?  ?Future Appointments   ? ?        ? In 1 month Zigmund Daniel Earley Abide, MD [facility], [place]  ? ?  ? ? ?  ?  ?  ? ?

## 2021-10-19 ENCOUNTER — Telehealth (INDEPENDENT_AMBULATORY_CARE_PROVIDER_SITE_OTHER): Payer: 59 | Admitting: Family Medicine

## 2021-10-19 ENCOUNTER — Encounter: Payer: Self-pay | Admitting: Family Medicine

## 2021-10-19 DIAGNOSIS — F418 Other specified anxiety disorders: Secondary | ICD-10-CM | POA: Diagnosis not present

## 2021-10-19 DIAGNOSIS — D751 Secondary polycythemia: Secondary | ICD-10-CM | POA: Diagnosis not present

## 2021-10-19 MED ORDER — CITALOPRAM HYDROBROMIDE 40 MG PO TABS: 40.0000 mg | ORAL_TABLET | Freq: Every day | ORAL | 1 refills | Status: DC

## 2021-10-19 NOTE — Assessment & Plan Note (Signed)
Instantly noted on recent labs, has been trending upward over the past several years, cannot exclude an element of hemoconcentration due to fasting state prior to labs however given the overall trend I have advised for home sleep study.  Can also be considered secondary to smoking exposure in the past.

## 2021-10-19 NOTE — Progress Notes (Signed)
Primary Care / Sports Medicine Virtual Visit  Patient Information:  Patient ID: Scott Aguilar, male DOB: [birth year] Age: 41 y.o. MRN: [id number]   Scott Aguilar is a pleasant 41 y.o. male presenting with the following:  Chief Complaint  Patient presents with   Depression    States medication is helping.     Review of Systems: No fevers, chills, night sweats, weight loss, chest pain, or shortness of breath.   Patient Active Problem List   Diagnosis Date Noted   Polycythemia 10/19/2021   Annual physical exam 08/19/2021   Obesity, Class III, BMI 40-49.9 (morbid obesity) ([place]) 08/19/2021   Depression with anxiety 08/09/2017   History of Clostridium difficile colitis    Acute diverticulitis 01/19/2017   Diverticulitis 01/07/2017   Past Medical History:  Diagnosis Date   Acute diverticulitis 01/19/2017   Allergy    Anxiety    Asthma    Childhood asthma   Cancer ([place]) 2013   melanoma back left shoulder   Depression    Diverticulitis 01/07/2017   Enteritis due to Clostridium difficile    Outpatient Encounter Medications as of 10/19/2021  Medication Sig   loratadine (CLARITIN) 10 MG tablet Take 10 mg by mouth daily.   [DISCONTINUED] citalopram (CELEXA) 40 MG tablet Take 1 tablet (40 mg total) by mouth daily.   citalopram (CELEXA) 40 MG tablet Take 1 tablet (40 mg total) by mouth daily.   [DISCONTINUED] Vitamin D, Ergocalciferol, (DRISDOL) 1.25 MG (50000 UNIT) CAPS capsule Take 1 capsule (50,000 Units total) by mouth every 7 (seven) days. Take for 8 total doses(weeks)   No facility-administered encounter medications on file as of 10/19/2021.   Past Surgical History:  Procedure Laterality Date   COLONOSCOPY WITH PROPOFOL N/A 04/20/2017   Procedure: COLONOSCOPY WITH PROPOFOL;  Surgeon: Lin Landsman, MD;  Location: [facility] ENDOSCOPY;  Service: Gastroenterology;  Laterality: N/A;   MELANOMA EXCISION  2013   Dr. Evorn Gong   WISDOM TOOTH EXTRACTION      Virtual Visit  via MyChart Video:   I connected with Scott Aguilar on 10/19/21 via MyChart Video and verified that I am speaking with the correct person using appropriate identifiers.   The limitations, risks, security and privacy concerns of performing an evaluation and management service by MyChart Video, including the higher likelihood of inaccurate diagnoses and treatments, and the availability of in person appointments were reviewed. The possible need of an additional face-to-face encounter for complete and high quality delivery of care was discussed. The patient was also made aware that there may be a patient responsible charge related to this service. The patient expressed understanding and wishes to proceed.  Provider location is in medical facility. Patient location is at their home, different from provider location. People involved in care of the patient during this telehealth encounter were myself, my nurse/medical assistant, and my front office/scheduling team member.  Objective findings:   General: Speaking full sentences, no audible heavy breathing. Sounds alert and appropriately interactive. Well-appearing. Face symmetric. Extraocular movements intact. Pupils equal and round. No nasal flaring or accessory muscle use visualized.  Independent interpretation of notes and tests performed by another provider:   None  Pertinent History, Exam, Impression, and Recommendations:   Depression with anxiety Patient endorses interval improvement on citalopram 40 mg, that being said scores are essentially stable with mild increase with right PHQ and GAD standpoint.  I discussed the same with the patient as well as adjuvant treatment strategies both from  a pharmacologic and nonpharmacologic standpoint.  He is amenable to cognitive behavioral therapy, referral has been placed in that regard and we will coordinate a 65-monthvirtual visit.  Polycythemia Instantly noted on recent labs, has been trending upward  over the past several years, cannot exclude an element of hemoconcentration due to fasting state prior to labs however given the overall trend I have advised for home sleep study.  Can also be considered secondary to smoking exposure in the past.  Orders & Medications Meds ordered this encounter  Medications   citalopram (CELEXA) 40 MG tablet    Sig: Take 1 tablet (40 mg total) by mouth daily.    Dispense:  90 tablet    Refill:  1   Orders Placed This Encounter  Procedures   Ambulatory referral to Psychiatry     I discussed the above assessment and treatment plan with the patient. The patient was provided an opportunity to ask questions and all were answered. The patient agreed with the plan and demonstrated an understanding of the instructions.   The patient was advised to call back or seek an in-person evaluation if the symptoms worsen or if the condition fails to improve as anticipated.   I provided a total time of 30 minutes including both face-to-face and non-face-to-face time on 10/19/2021 inclusive of time utilized for medical chart review, information gathering, care coordination with staff, and documentation completion.    JMontel Culver MD   Primary Care Sports Medicine [place]

## 2021-10-19 NOTE — Assessment & Plan Note (Signed)
Patient endorses interval improvement on citalopram 40 mg, that being said scores are essentially stable with mild increase with right PHQ and GAD standpoint.  I discussed the same with the patient as well as adjuvant treatment strategies both from a pharmacologic and nonpharmacologic standpoint.  He is amenable to cognitive behavioral therapy, referral has been placed in that regard and we will coordinate a 28-monthvirtual visit.

## 2021-10-19 NOTE — Patient Instructions (Signed)
-   Continue current dose of citalopram 40 mg - Referral coronary will contact in regards to starting cognitive behavioral therapy - Home sleep study will be mailed out - 69-monthfollow-up (contact [place] any questions between now and then)

## 2022-04-21 ENCOUNTER — Ambulatory Visit (INDEPENDENT_AMBULATORY_CARE_PROVIDER_SITE_OTHER): Payer: Self-pay | Admitting: Nurse Practitioner

## 2022-04-21 ENCOUNTER — Encounter: Payer: Self-pay | Admitting: Nurse Practitioner

## 2022-04-21 VITALS — BP 145/96 | HR 68 | Ht 75.0 in | Wt 338.1 lb

## 2022-04-21 DIAGNOSIS — F418 Other specified anxiety disorders: Secondary | ICD-10-CM

## 2022-04-21 DIAGNOSIS — K5792 Diverticulitis of intestine, part unspecified, without perforation or abscess without bleeding: Secondary | ICD-10-CM

## 2022-04-21 MED ORDER — CITALOPRAM HYDROBROMIDE 40 MG PO TABS: 40.0000 mg | ORAL_TABLET | Freq: Every day | ORAL | 1 refills | Status: AC

## 2022-04-21 NOTE — Progress Notes (Signed)
New Patient Office Visit  Subjective    Patient ID: Scott Aguilar, male    DOB: 1981-05-02  Age: 41 y.o. MRN: [id number]  CC: No chief complaint on file.   HPI [name] Hy presents to establish care. His previous provider was Dr. Corene Cornea. He has history of anxiety and depression and  is taking citalopram 40 mg daily.  Outpatient Encounter Medications as of 04/21/2022  Medication Sig   citalopram (CELEXA) 40 MG tablet Take 1 tablet (40 mg total) by mouth daily.   loratadine (CLARITIN) 10 MG tablet Take 10 mg by mouth daily.   [DISCONTINUED] citalopram (CELEXA) 40 MG tablet Take 1 tablet (40 mg total) by mouth daily.   No facility-administered encounter medications on file as of 04/21/2022.    Past Medical History:  Diagnosis Date   Acute diverticulitis 01/19/2017   Allergy    Anxiety    Asthma    Childhood asthma   Cancer ([place]) 2013   melanoma back left shoulder   Depression    Diverticulitis 01/07/2017   Enteritis due to Clostridium difficile     Past Surgical History:  Procedure Laterality Date   COLONOSCOPY WITH PROPOFOL N/A 04/20/2017   Procedure: COLONOSCOPY WITH PROPOFOL;  Surgeon: Lin Landsman, MD;  Location: [facility] ENDOSCOPY;  Service: Gastroenterology;  Laterality: N/A;   MELANOMA EXCISION  2013   Dr. Evorn Gong   WISDOM TOOTH EXTRACTION      Family History  Problem Relation Age of Onset   Heart disease Mother    Depression Mother    Heart disease Father    Hypertension Father    Heart murmur Sister    Cancer Maternal Uncle    Cancer Paternal Grandmother    Skin cancer Paternal Grandfather     Social History   Socioeconomic History   Marital status: Married    Spouse name: Isa Kohlenberg   Number of children: 2   Years of education: 12+   Highest education level: Some college, no degree  Occupational History   Not on file  Tobacco Use   Smoking status: Former    Years: 8.00    Types: Cigarettes    Quit date: 2002    Years since  quitting: 21.9   Smokeless tobacco: Current    Types: Snuff  Vaping Use   Vaping Use: Some days   Substances: Nicotine, THC, Flavoring  Substance and Sexual Activity   Alcohol use: Yes    Comment: occasionally   Drug use: Yes    Frequency: 1.0 times per week    Types: Marijuana   Sexual activity: Yes    Partners: Female  Other Topics Concern   Not on file  Social History Narrative   Not on file   Social Determinants of Health   Financial Resource Strain: Not on file  Food Insecurity: Not on file  Transportation Needs: Not on file  Physical Activity: Not on file  Stress: Not on file  Social Connections: Not on file  Intimate Partner Violence: Not on file    Review of Systems  Constitutional: Negative.   HENT: Negative.    Eyes: Negative.   Respiratory:  Negative for cough and shortness of breath.   Cardiovascular:  Negative for chest pain and leg swelling.  Gastrointestinal:        Diverticulitis   Skin: Negative.   Neurological:  Negative for dizziness, tingling and headaches.  Psychiatric/Behavioral:  Positive for depression. Negative for substance abuse and suicidal ideas.  Objective    BP (!) 145/96   Pulse 68   Ht '6\' 3"'$  (1.905 m)   Wt (!) 338 lb 1.6 oz (153.4 kg)   BMI 42.26 kg/m   Physical Exam Constitutional:      Appearance: Normal appearance. He is obese.  HENT:     Right Ear: Tympanic membrane normal.     Left Ear: Tympanic membrane normal.     Mouth/Throat:     Mouth: Mucous membranes are moist.     Pharynx: Oropharynx is clear.  Eyes:     Extraocular Movements: Extraocular movements intact.     Conjunctiva/sclera: Conjunctivae normal.     Pupils: Pupils are equal, round, and reactive to light.  Cardiovascular:     Rate and Rhythm: Normal rate and regular rhythm.     Pulses: Normal pulses.     Heart sounds: Normal heart sounds.  Pulmonary:     Effort: Pulmonary effort is normal.     Breath sounds: Normal breath sounds.   Abdominal:     General: Bowel sounds are normal. There is distension.     Palpations: Abdomen is soft.     Hernia: No hernia is present.  Musculoskeletal:        General: Normal range of motion.  Skin:    General: Skin is warm.     Capillary Refill: Capillary refill takes less than 2 seconds.  Neurological:     General: No focal deficit present.     Mental Status: He is alert and oriented to person, place, and time. Mental status is at baseline.  Psychiatric:        Mood and Affect: Mood normal.        Behavior: Behavior normal.        Thought Content: Thought content normal.        Judgment: Judgment normal.         Assessment & Plan:   Problem List Items Addressed This Visit       Other   Diverticulitis - Primary    Stable at present.      Depression with anxiety    His PHQ score 10. Will start her on citalopram 40 mg. Advised him to do deep breathing exercises, meditation and journaling. If needed would send him to the mental health provider.      Relevant Medications   citalopram (CELEXA) 40 MG tablet   Obesity, Class III, BMI 40-49.9 (morbid obesity) ([place])    Body mass index is 42.26 kg/m. Advised pt to lose weight. Advised patient to avoid trans fat, fatty and fried food. Follow a regular physical activity schedule. Went over the risk of chronic diseases with increased weight.           No follow-ups on file.   Theresia Lo, NP

## 2022-04-29 NOTE — Assessment & Plan Note (Signed)
Stable at present.   

## 2022-04-29 NOTE — Assessment & Plan Note (Signed)
His PHQ score 10. Will start her on citalopram 40 mg. Advised him to do deep breathing exercises, meditation and journaling. If needed would send him to the mental health provider.

## 2022-04-29 NOTE — Assessment & Plan Note (Signed)
Body mass index is 42.26 kg/m. Advised pt to lose weight. Advised patient to avoid trans fat, fatty and fried food. Follow a regular physical activity schedule. Went over the risk of chronic diseases with increased weight.

## 2023-03-23 ENCOUNTER — Other Ambulatory Visit: Payer: Self-pay | Admitting: Nurse Practitioner

## 2023-03-23 DIAGNOSIS — F418 Other specified anxiety disorders: Secondary | ICD-10-CM

## 2023-03-24 NOTE — Telephone Encounter (Signed)
Pt has not seen you. Is it okay to refuse refill?
